# Patient Record
Sex: Female | Born: 1935 | Race: White | Hispanic: No | Marital: Married | Smoking: Former smoker
Health system: Southern US, Community
[De-identification: ages and names within clinical notes are randomized; demographics above are authoritative.]

## PROBLEM LIST (undated history)

## (undated) DIAGNOSIS — H269 Unspecified cataract: Secondary | ICD-10-CM

## (undated) DIAGNOSIS — I10 Essential (primary) hypertension: Secondary | ICD-10-CM

## (undated) DIAGNOSIS — E785 Hyperlipidemia, unspecified: Secondary | ICD-10-CM

## (undated) DIAGNOSIS — M1990 Unspecified osteoarthritis, unspecified site: Secondary | ICD-10-CM

## (undated) DIAGNOSIS — F329 Major depressive disorder, single episode, unspecified: Secondary | ICD-10-CM

## (undated) DIAGNOSIS — F0390 Unspecified dementia without behavioral disturbance: Secondary | ICD-10-CM

## (undated) DIAGNOSIS — E119 Type 2 diabetes mellitus without complications: Secondary | ICD-10-CM

## (undated) DIAGNOSIS — J449 Chronic obstructive pulmonary disease, unspecified: Secondary | ICD-10-CM

## (undated) DIAGNOSIS — M810 Age-related osteoporosis without current pathological fracture: Secondary | ICD-10-CM

## (undated) DIAGNOSIS — F32A Depression, unspecified: Secondary | ICD-10-CM

## (undated) HISTORY — DX: Essential (primary) hypertension: I10

## (undated) HISTORY — DX: Age-related osteoporosis without current pathological fracture: M81.0

## (undated) HISTORY — DX: Unspecified cataract: H26.9

## (undated) HISTORY — DX: Unspecified osteoarthritis, unspecified site: M19.90

## (undated) HISTORY — DX: Major depressive disorder, single episode, unspecified: F32.9

## (undated) HISTORY — DX: Type 2 diabetes mellitus without complications: E11.9

## (undated) HISTORY — DX: Hyperlipidemia, unspecified: E78.5

## (undated) HISTORY — DX: Depression, unspecified: F32.A

## (undated) HISTORY — DX: Unspecified dementia, unspecified severity, without behavioral disturbance, psychotic disturbance, mood disturbance, and anxiety: F03.90

## (undated) HISTORY — DX: Chronic obstructive pulmonary disease, unspecified: J44.9

---

## 1998-11-19 ENCOUNTER — Encounter: Payer: Self-pay | Admitting: Emergency Medicine

## 1998-11-19 ENCOUNTER — Emergency Department (HOSPITAL_COMMUNITY): Admission: EM | Admit: 1998-11-19 | Discharge: 1998-11-19 | Payer: Self-pay | Admitting: Emergency Medicine

## 1998-11-28 ENCOUNTER — Emergency Department (HOSPITAL_COMMUNITY): Admission: EM | Admit: 1998-11-28 | Discharge: 1998-11-28 | Payer: Self-pay | Admitting: Emergency Medicine

## 1998-12-12 ENCOUNTER — Emergency Department (HOSPITAL_COMMUNITY): Admission: EM | Admit: 1998-12-12 | Discharge: 1998-12-12 | Payer: Self-pay | Admitting: Emergency Medicine

## 1999-01-06 ENCOUNTER — Emergency Department (HOSPITAL_COMMUNITY): Admission: EM | Admit: 1999-01-06 | Discharge: 1999-01-06 | Payer: Self-pay | Admitting: Emergency Medicine

## 2002-12-18 ENCOUNTER — Emergency Department (HOSPITAL_COMMUNITY): Admission: EM | Admit: 2002-12-18 | Discharge: 2002-12-18 | Payer: Self-pay | Admitting: Emergency Medicine

## 2004-03-26 ENCOUNTER — Emergency Department (HOSPITAL_COMMUNITY): Admission: EM | Admit: 2004-03-26 | Discharge: 2004-03-26 | Payer: Self-pay | Admitting: Emergency Medicine

## 2004-06-30 ENCOUNTER — Inpatient Hospital Stay (HOSPITAL_COMMUNITY): Admission: EM | Admit: 2004-06-30 | Discharge: 2004-07-09 | Payer: Self-pay | Admitting: Emergency Medicine

## 2004-11-16 ENCOUNTER — Inpatient Hospital Stay (HOSPITAL_COMMUNITY): Admission: EM | Admit: 2004-11-16 | Discharge: 2004-11-23 | Payer: Self-pay | Admitting: Emergency Medicine

## 2006-10-11 ENCOUNTER — Encounter (HOSPITAL_BASED_OUTPATIENT_CLINIC_OR_DEPARTMENT_OTHER): Admission: RE | Admit: 2006-10-11 | Discharge: 2006-11-01 | Payer: Self-pay | Admitting: Surgery

## 2006-12-06 ENCOUNTER — Encounter: Admission: RE | Admit: 2006-12-06 | Discharge: 2006-12-06 | Payer: Self-pay | Admitting: *Deleted

## 2008-01-23 ENCOUNTER — Emergency Department (HOSPITAL_COMMUNITY): Admission: EM | Admit: 2008-01-23 | Discharge: 2008-01-23 | Payer: Self-pay | Admitting: Emergency Medicine

## 2008-04-11 ENCOUNTER — Encounter: Admission: RE | Admit: 2008-04-11 | Discharge: 2008-04-11 | Payer: Self-pay | Admitting: Internal Medicine

## 2009-05-11 IMAGING — US MAMMO-RUNI-US
1 series · 6 of 6 positions shown · non-contrast
Comparison: NONE

CLINICAL DATA: Zeinab Tiger, RT(R)(M)   
Diagnostic  Mammogram.  Six-month follow-up. 

RIGHT BREAST MAMMOGRAM AND RIGHT BREAST ULTRASOUND

[Series 1: us breast · 0.07mm/px · 6 of 6 slices shown]
[im 1/6]
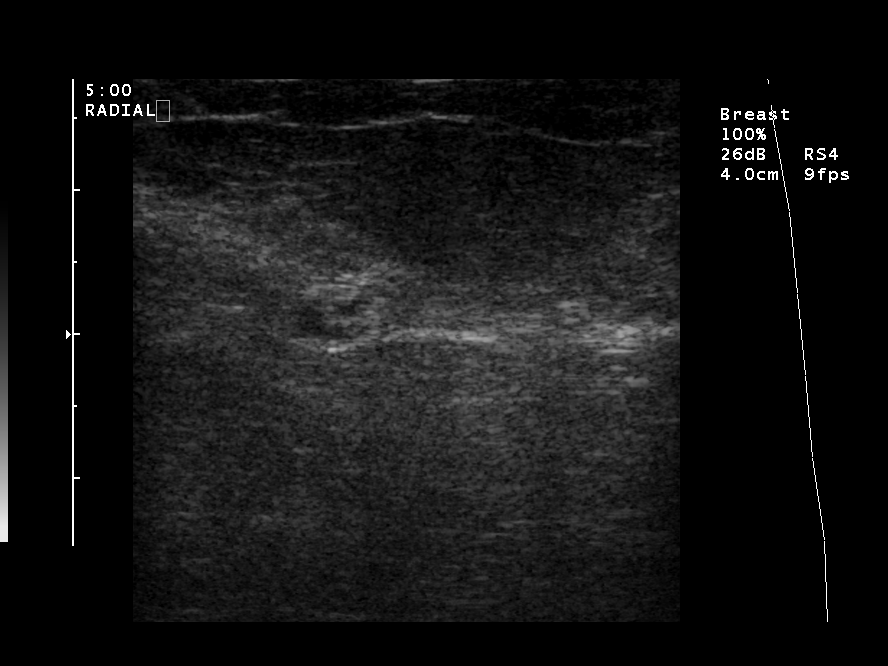
[im 2/6]
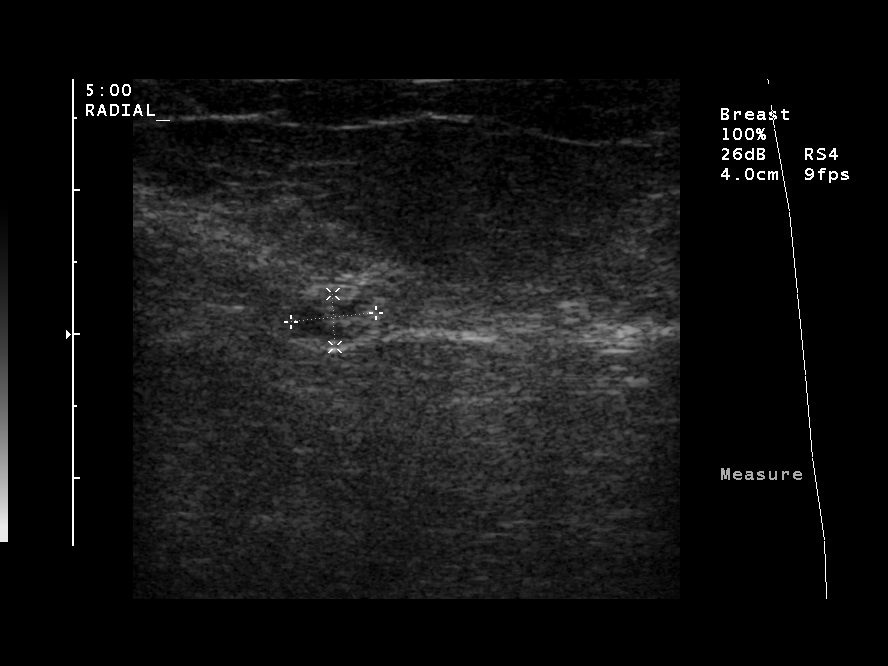
[im 3/6]
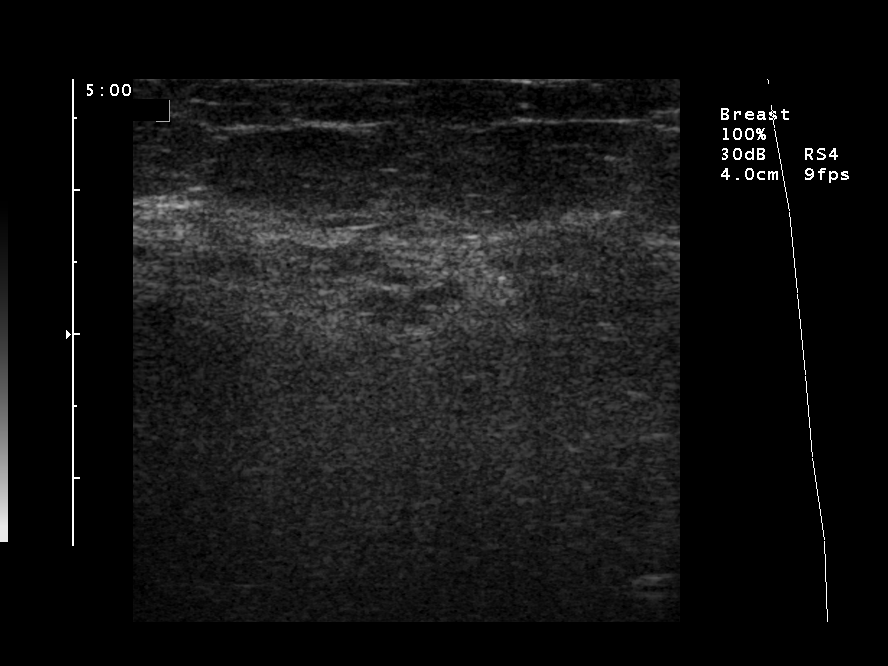
[im 4/6]
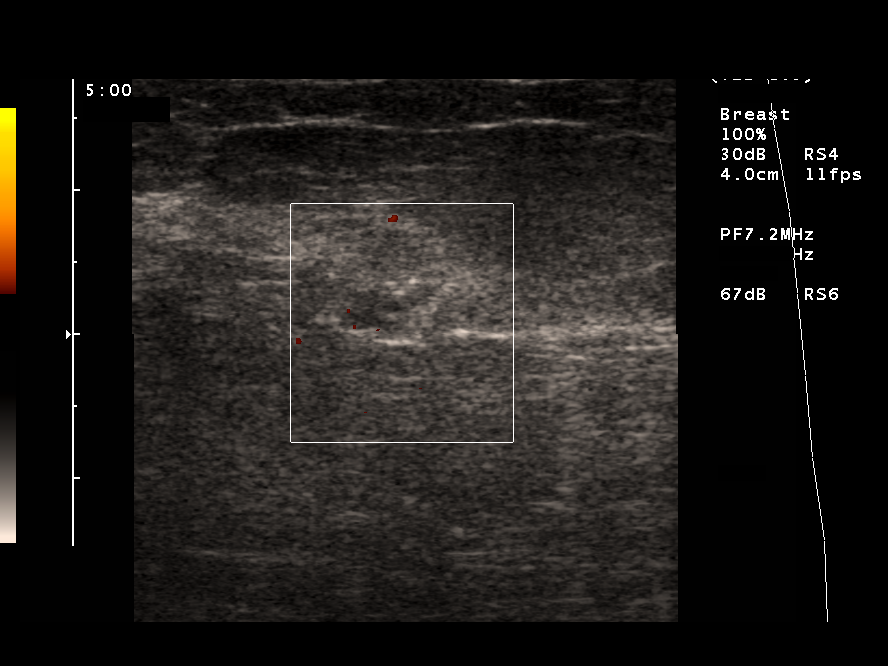
[im 5/6]
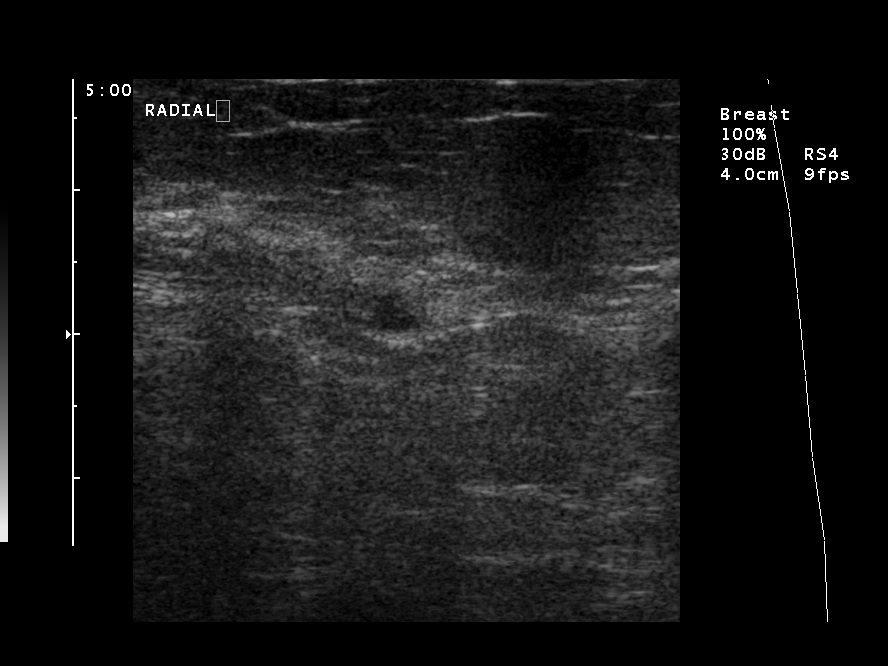
[im 6/6]
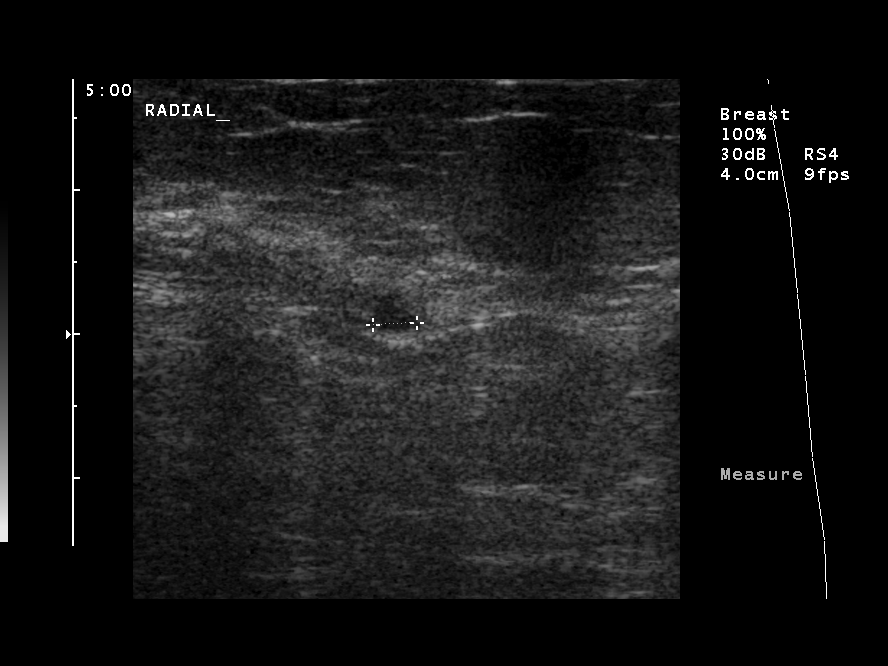

[6 of 6 positions shown; findings below may reference images not displayed]

FINDINGS: There is a moderately dense breast parenchyma.  There 
is a prior mammogram dated 12-17-05 for comparison.  The patient 
was difficult to image because she was unable to fully cooperate.  
There are benign calcifications seen in the right breast.  The 
mass noted in the [DATE] position in the right breast appears to be 
decreased in size.  There is no evidence of architectural 
distortion or malignant appearing microcalcifications. Ultrasound 
was performed of the right breast.  The [DATE] position was again 
evaluated.  There is a cluster of cysts seen.  This appears to be 
decreased in size compared to the previous ultrasound study of 
12-31-05.  The overall size of the cluster is 6 x 4 x 3 mm.  
Previously this measured 11 x 3 x 7 mm.
IMPRESSION: The mass in the [DATE] position of the right breast is 
decreased in size.  There is no mammographic evidence of 
malignancy. I recommend six-month interval follow-up right breast 
mammogram.  This will coincide with a time for a bilateral 
mammogram. The cluster of cysts seen in the [DATE] position of the 
right breast has decreased in size.  This does not need to be 
further evaluated. The patient was informed at the time of the 
examination of the findings and recommendations by verbal and 
written lay report. Computer assisted (Second Look) technology was 
used as an aid in interpretation of this study. BI-RADS 3 - 
08/16/2006 Dict Date: 08/16/2006  Trans Date: 08/16/2006 JH  ROMUALDO

## 2010-06-16 NOTE — Assessment & Plan Note (Signed)
[facility]   NAME:  Suzanne Bell, Suzanne Bell NO.:  [id number]   MEDICAL RECORD NO.:  [id number]      DATE OF BIRTH:  [birth year]   PHYSICIAN:  Jake Shark A. Tanda Rockers, M.D. VISIT DATE:  10/24/2006                                   OFFICE VISIT   SUBJECTIVE:  Suzanne Bell is an 77 year old lady who is referred by Dr.  Talbert Cage of the [facility] in [place] for evaluation  of an ulceration on the right ankle.   IMPRESSION:  Stasis ulcer lateral malleolus of the right ankle.   RECOMMENDATIONS:  Proceed with an Radio broadcast assistant protocol.   SUBJECTIVE:  Suzanne Bell is an 77 year old lady who is a resident of  [facility].  She has severe Alzheimer's disease and is really unable to  communicate her recent history.  She is transported by her medical  attendants who are not thoroughly versed in her past history.  Nevertheless, review of the accompanying documents shows that the  patient has complained of this wound for at least one week, and it has  been treated with a course of antibiotics.   Her medication list discloses that she has no known allergies, and her  active medications include aspirin 81 mg a day, Colace, lisinopril,  Zocor, Seroquel, Xanax, K-Dur, Lexapro and Levaquin.  Past surgical  history, family history, social history and review of systems are non-  obtainable due to the patient's physical and mental status.   OBJECTIVE:  VITAL SIGNS:  Blood pressure is 141/65, respirations are 14,  pulse rate 62, temperature is 97.1.  HEENT:  Remarkable for an opacity in the right eye.  There is equal  ocular movement.  The trachea is midline.  Thyroid is nonpalpable.  LUNGS:  Clear.  HEART:  Sounds were normal.  ABDOMEN:  Soft.  EXTREMITY:  Remarkable for bilateral 2+ edema with an area of necrosis  on the right lateral malleolus.  There is no excessive drainage.  There  is no hyperemia.  There is no evidence of ascending infection.  Both  feet  are warm with a brisk capillary refill.  Pedal pulses are  indeterminate, but the ankle-brachial index accompanying the patient  shows an ABI of 0.86 on the right and 0.94 on the left.  NEUROLOGICAL:  The patient retains protective sensation as judged by the  [facility] filament.  Neurologically, the patient has markedly impaired  memory.  She does not remember her breakfast or if she did have  breakfast, or if she did, what she ate.  She is disoriented to time and  place.  She has minimum quantitative and cognitive retention.   DISCUSSION:  This 77 year old lady has an early stasis ulceration over  the right lateral malleolus.  We will place her in an Unna boot and will  advise precaution to avoid extreme swelling and dependency.  We have  explained these  precautions to her attendants in terms they seem to understand.  We will  also make available immediately this consultation to Dr. Talbert Cage.  We  will reevaluate the patient in one week.  We have also noted the  patient's increased risks for falls and have ensured that the attendants  remain with her throughout her visitations.  Harold A. Tanda Rockers, M.D.  Electronically Signed     HAN/MEDQ  D:  10/24/2006  T:  10/24/2006  Job:  [id number]   cc:   [facility] Dr. Talbert Cage

## 2010-06-16 NOTE — Assessment & Plan Note (Signed)
[facility]   NAME:  Suzanne Bell, RYBOLT NO.:  [id number]   MEDICAL RECORD NO.:  [id number]      DATE OF BIRTH:  1919/05/10   PHYSICIAN:  Jake Shark A. Tanda Rockers, M.D. VISIT DATE:  10/31/2006                                   OFFICE VISIT   SUBJECTIVE:  Suzanne Bell is an 77 year old lady who returns for followup  of a stasis ulcer involving the right malleolus.  In the interim, we  have treated her with compression wraps.  She returns for followup.  There has been no interim pain, fever, drainage or malodor.   OBJECTIVE:  VITAL SIGNS:  Blood pressure is 158/71, respirations 16,  pulse rate 74, temperature 97.7.  EXTREMITIES:  Inspection of the right lateral ankle shows that the wound  is completely resolved.  There is some residual mild hyperemia, but  there is definitely no ulcerations.  There is no evidence of acute  ischemia.   ASSESSMENT:  Resolved wound.   PLAN:  We are discharging the patient.  We have instructed the caretaker  to continue using an Ace bandage for compression when the patient has  extended periods of dependency.  We will reevaluate her on a p.r.n.  basis.      Harold A. Tanda Rockers, M.D.  Electronically Signed     HAN/MEDQ  D:  10/31/2006  T:  11/01/2006  Job:  [id number]   cc:   Dr. Livingston Diones

## 2010-06-19 NOTE — Discharge Summary (Signed)
NAME:  Suzanne Bell, VARY NO.:  [id number]   MEDICAL RECORD NO.:  [id number]          PATIENT TYPE:  INP   LOCATION:  1411                         FACILITY:  [facility]   PHYSICIAN:  Theone Stanley, MD   DATE OF BIRTH:  05-26-1919   DATE OF ADMISSION:  06/30/2004  DATE OF DISCHARGE:                           DISCHARGE SUMMARY - REFERRING   ADMISSION DIAGNOSES:  1.  Alzheimer's disease.  2.  Panic disorder.  3.  Failure to thrive.   DISCHARGE DIAGNOSES:  1.  Urinary tract infection.  2.  Alzheimer's disease.  3.  Panic disorder.  4.  Failure to thrive.   CONSULTATIONS:  None.   PROCEDURES AND DIAGNOSTIC TESTS:  CT of the head performed on July 04, 2004.  No acute intracranial findings.  Stable cerebral atrophy.   HOSPITAL COURSE:  Mrs. Dusing is an 77 year old female who has been living  with her husband for many years who has chronic medical problem of panic  disorder.  Family has noticed increasing problems with memory and has become  increasingly reliant on her husband.  She no longer dresses herself, feeds  herself or baths herself.  She relies heavily on her husband.  She, in fact,  falls frequently at home.  She has left the house looking for her home.  Unfortunately, her husband fell about two weeks prior to admission and had  to go to an extended care facility.  The family was attempting to take care  of her, however, they felt that they could not, so they brought her here to  the hospital.  During her stay in the hospital, she subsequently developed  urinary tract infection, became very febrile and she was started on broad  spectrum antibiotics, switched to ciprofloxacin and has been going well  since then.  Also of note, she has evidence of hypertension.  She was  started on hydrochlorothiazide with intermittent episodes of hypokalemia.  Because of this, this will be discontinued and her Norvasc which was  started, will be increased to 10 mg.  Patient  did have a cough while here in  the hospital.  A chest x-ray was performed which did not show any active  issues.  Patient is discharged to extended care facility in stable  condition.   DIET:  Regular.   DISCHARGE MEDICATIONS:  1.  Ciprofloxacin 500 mg one p.o. b.i.d. for additional four days.  2.  Norvasc 10 mg one p.o. daily.  3.  Albuterol MDI or nebulizer q.i.d.  4.  Ensure t.i.d.  5.  Tylenol 650 mg q.4-6h. p.r.n.  6.  Haloperidol 0.5 to 1 mg p.o. q.6h. p.r.n.  7.  Xanax 0.25 mg q.i.d.  8.  Zofran 4 mg one p.o. q.6h. p.r.n.   FOLLOW UP:  Patient is to follow up with Dr. Arvilla Market in four weeks.  While  in the extended care facility, patient will need a follow-up BMP to check  her potassium.       AEJ/MEDQ  D:  07/09/2004  T:  07/09/2004  Job:  [id number]   cc:   Donia Guiles,  M.D.  [address] [place]  [place] [address]  Fax: [phone]

## 2010-06-19 NOTE — H&P (Signed)
NAME:  Suzanne Bell, BOWERY NO.:  [id number]   MEDICAL RECORD NO.:  [id number]          PATIENT TYPE:  OBV   LOCATION:  1823                         FACILITY:  [facility]   PHYSICIAN:  Sherin Quarry, MD      DATE OF BIRTH:  03-Mar-1919   DATE OF ADMISSION:  11/15/2004  DATE OF DISCHARGE:                                HISTORY & PHYSICAL   Renesmay Nesbitt is an 77 year old lady who lives at home with her elderly  husband.  Their son also stays with him much of the time.  There are two  other children who also follow their situation closely.  Family members have  observed Mrs. Slovacek seems to be gradually deteriorating in terms her mental  function.  She will pack her bags every day to go see her mother, and  frequently expresses a desire to go to heaven.  She he is often very anxious  and continues to require Xanax 1 mg daily for management of panic attacks.  Family has observed that she is very unsteady on her feet and has fallen at  least three times recently.  Her husband is very concerned about her  welfare, and is responsible for feeding her.  She wears Depends as she is  incontinent of urine.  She will ambulate with a walker.  She requires  assistance with dressing and bathing.  Tonight the patient apparently put on  her tennis shoes and stood up from bed with the idea of walking across the  room, when she did this she became unsteady on her feet and fell landing on  her face.  She has sustained a nondisplaced nasal fracture.  Initially,  there was a lot of bleeding from the nose, but fortunately this has stopped.  She is brought to the emergency room where her daughter and son indicate  that they feel very strongly that she should not return home because it is  an unsafe environment and she should be placed at least on a short-term  basis in a nursing home facility.  The daughter apparently has health care  power of attorney.  In the emergency room a CT scan of the facial  bones was  done which confirmed the nondisplaced nasal fracture, and a CT scan of brain  was negative.   PAST MEDICAL HISTORY:  There are no allergies.  The only medication the  patient takes is Xanax 1 mg q.i.d. Illnesses are none with the exception of  progressive dementia.  Operations are none.  Hospitalizations are otherwise  none except for a hospitalization in May of this year which was with the  purpose of arranging for nursing home placement at that time.   FAMILY HISTORY:  Her daughter died as result of brain tumor.  Her two  brothers died from colon cancer.  She has a sister who has a history of  diabetes and also deceased.   SOCIAL HISTORY:  She does not smoke.  She does not use alcohol.  She has  deficient vision and is a greatly impaired by problems related to her brain  function as described above.   REVIEW OF SYSTEMS:  Obtained from the patient's daughter.  HEAD:  Her  daughter reports that she does not complain of headache.  EAR/NOSE/THROAT:  She does not complain of earache, sinus pain or sore throat, of course at  this point she has some soreness related to the nasal injury.  CHEST:  She  does not appear to have any problems with coughing, wheezing or shortness of  breath.  CARDIOVASCULAR:  There is no apparent problems with chest pain or  orthopnea.  GI:  She has no problems with indigestion, heartburn, vomiting  or diarrhea.  GU:  She was recently treated for a urinary tract infection.  NEURO:  See above.  Note that during the course of my conversation with the  patient's daughter, I brought the issue of her code status.  The patient and  her husband apparently both expressed a strong desire to be full code  status, the husband feels very strongly that his wife should be placed on a  respirator if she should have any impairment of her breathing function.  The  daughter who has health care power of attorney plans to follow these wishes.   PHYSICAL EXAMINATION:   HEENT:  Exam is remarkable for a cataract in the  right eye, also there is swelling around the nasal area.  The nose does not  appear to be obviously displaced.  There is no blood in the nares, the  patient is not having any respiratory distress related to nasal obstruction.  The tympanic membranes were clear.  Pharynx is without erythema or exudate.  NECK:  Supple.  CHEST:  Clear.  Examination the back reveals no CVA or point tenderness.  CARDIOVASCULAR:  Reveals normal S1, S2.  There are no rubs, murmurs or  gallops.  ABDOMEN:  Benign.  There are normal bowel sounds.  There is no masses or  tenderness.  No guarding or rebound.  The patient wears Depends.  NEUROLOGIC:  Testing and examination of the extremities is normal.   IMPRESSION:  1.  Nondisplaced nasal fracture.  2.  Progressively worsening dementia with frequent falls, unsafe home      environment.  3.  Chronic panic attacks, chronic Xanax therapy.   PLAN:  We will admit the patient for 24-hour observation and hopefully  arrange nursing home placement at the family's request.           ______________________________  Sherin Quarry, MD     SY/MEDQ  D:  11/15/2004  T:  11/15/2004  Job:  [id number]   cc:   Donia Guiles, M.D.  Fax: [phone]

## 2010-06-19 NOTE — Discharge Summary (Signed)
NAME:  Suzanne Bell NO.:  [id number]   MEDICAL RECORD NO.:  [id number]          PATIENT TYPE:  INP   LOCATION:  5729                         FACILITY:  [facility]   PHYSICIAN:  Sherin Quarry, MD      DATE OF BIRTH:  Jun 04, 1919   DATE OF ADMISSION:  11/15/2004  DATE OF DISCHARGE:  11/19/2004                                 DISCHARGE SUMMARY   HISTORY:  Suzanne Bell is a demented lady who lives at home with her elderly  husband. She is chronically anxious, confused and incontinent of urine. She  will generally ambulate with a walker and requires assistance with dressing  and bathing.  The patient initially presented to [facility] on  November 15, 2004; after sustaining a nondisplaced nasal fracture following a  fall.  In the emergency room a CT scan of the facial bones was done,  confirming the presence of this nasal fracture and the CT scan of the brain  was negative.   PHYSICAL EXAM:  HEENT:  At time of admission revealed a cataract in the  right eye as well as generalized swelling of the nasal area. There was no  displacement of the nasal structures.  CHEST:  Clear.  BACK:  No CVA or point tenderness.  CARDIOVASCULAR:  Reveals normal S1-S2 without gross murmurs or gallops.  ABDOMEN:  Benign with normal bowel sounds; without masses or tenderness.  NEUROLOGIC:  Testing examination of the extremities was normal.   HOSPITAL COURSE:  Because of the patient's worsening dementia, the main goal  of admission was that the family wished to arrange for nursing home  placement. This process took a prolonged period of time to accomplish,  during that time while in the hospital understandably the patient was  confused, particularly at night.  She required Posey restraints at night and  the use of sitters.  On October 18 she did receive one dose of Haldol  because of agitation.  On 10/19  the patient's family was offered nursing  home beds, but they decided that they  wanted the patient to go home.  Therefore,  the patient was discharged.   DISCHARGE DIAGNOSES:  1.  Nondisplaced nasal fracture.  2.  Worsening dementia with frequent falls.  3.  Chronic panic attacks, Xanax therapy.   DISCHARGE MEDICATIONS:  Xanax 1 mg q.i.d. which she has been on for many  years.   CONDITION AT TIME OF DISCHARGE:  Fair.           ______________________________  Sherin Quarry, MD     SY/MEDQ  D:  11/19/2004  T:  11/19/2004  Job:  [id number]

## 2010-06-19 NOTE — Discharge Summary (Signed)
NAME:  FLORENDA, WATT NO.:  [id number]   MEDICAL RECORD NO.:  [id number]          PATIENT TYPE:  INP   LOCATION:  5729                         FACILITY:  [facility]   PHYSICIAN:  Sherin Quarry, MD      DATE OF BIRTH:  03-20-1919   DATE OF ADMISSION:  11/15/2004  DATE OF DISCHARGE:                                 DISCHARGE SUMMARY   UPDATE:  Subsequent to October 19 the family who had originally decided  against nursing home placement and canceled this plan on October 19 decided  that they did wish to proceed with nursing home placement.  Between October  19 and October 23 the patient remained in the hospital.  She would be  agitated at night and relatively calm during the day.  She required  placement of a Posey vest at night.  She would intermittently complain of  left leg pain.  Careful examination of the left leg showed full range of  motion of the hip, full range of motion of the knee, no signs of swelling,  erythema, or ecchymosis.  To be thorough x-rays were obtained of the hip and  knee and these were negative.  Possibly the patient's complaints were  secondary to degenerative joint disease.  At the time of discharge discharge  diagnoses were unchanged from those of October 19.   DISCHARGE DIAGNOSES:  1.  Nondisplaced nasal fracture.  2.  Worsening dementia with frequent falls.  3.  Chronic panic attacks, long-standing history of Xanax therapy.   DISCHARGE MEDICATIONS:  1.  Xanax 1 mg q.i.d.  2.  K-Dur 10 mEq b.i.d.  3.  Darvocet one to two every six hours p.r.n. for pain.  4.  Tylenol 325 mg one to two every six hours p.r.n. for pain.   CONDITION ON DISCHARGE:  Fair.           ______________________________  Sherin Quarry, MD     SY/MEDQ  D:  11/23/2004  T:  11/23/2004  Job:  [id number]
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
## 2011-07-29 ENCOUNTER — Ambulatory Visit (HOSPITAL_COMMUNITY)
Admission: RE | Admit: 2011-07-29 | Discharge: 2011-07-29 | Disposition: A | Discharge status: Home / Self Care | Payer: Medicare Other | Source: Ambulatory Visit | Attending: Internal Medicine | Admitting: Internal Medicine

## 2011-07-29 DIAGNOSIS — T17998A Other foreign object in respiratory tract, part unspecified causing other injury, initial encounter: Secondary | ICD-10-CM

## 2011-07-29 DIAGNOSIS — T17308A Unspecified foreign body in larynx causing other injury, initial encounter: Secondary | ICD-10-CM | POA: Insufficient documentation

## 2011-07-29 DIAGNOSIS — IMO0002 Reserved for concepts with insufficient information to code with codable children: Secondary | ICD-10-CM | POA: Insufficient documentation

## 2011-07-29 NOTE — Procedures (Signed)
Objective Swallowing Evaluation: Modified Barium Swallowing Study  Patient Details  Name: Suzanne Bell MRN: [id number] Date of Birth: [birth year]  Today's Date: 07/29/2011 Time: 1010-1055 SLP Time Calculation (min): 45 min  Past Medical History: No past medical history on file. Past Surgical History: No past surgical history on file. HPI:  78 yo resident of [facility] referred for MBS due to pt recently with emesis and coughing episodes with only first bolus of puree.  PMH + severe dementia, DM, recurrent falls in 04 and 06.  Per daughter [name], pt with recent pneumonia approx 3 weeks ago.  Daughter  acknowledges that pt had difficulties swallowing with gradual progression but with sudden worsening approx 3-4 weeks ago.  Medication list includes (but not limited to) Senokot, Namenda, Metamucil, Glucophage, Lisinopiril, Vit D per information sent from facility.  Pt's daughter denies CVA hx.         Assessment / Plan / Recommendation Clinical Impression  Clinical impression: Suspect oral-cognitive based dysphagia due to Alzheimer's disease, also ? facial nerve involvement given left anterior spillage of liquids.   No aspiration observed with all consistencies tested (moist cracker, pudding, nectar, thin) with inconsistent trace laryngeal penetration of liquids which is likely functional given pt's age.  Oral  difficulties with delayed transting of thicker viscocities and vertical masticastion pattern observed.  Decr oral strength resulted in oral posterior tongue mild residuals without pt awareness, nor did pt conduct cued dry swallow to clear.  Of note, pt accepted only small bolues of pudding (appr 1/4 tsp) throughout study.  Reflexive cough noted x1 with first bolus of study and incidental laryngeal penetration.  Further episodes of trace penetration did not result in cough.  In addition, pt coughed in sequence of three without barium visualized in larynx.  Question if pt's recent health issue,  (pna per daughter) has impacted her swallow ability and intake.       Treatment Recommendation       Diet Recommendation Dysphagia 1 (Puree);Thin liquid   Liquid Administration via: Cup;Straw (encourage self feeding if able-slp had pt hold cup) Supervision: Full supervision/cueing for compensatory strategies Compensations: Check for pocketing;Slow rate;Unable to follow commands to complete;Small sips/bites (drink liquids throughout meal) Postural Changes and/or Swallow Maneuvers: Upright 30-60 min after meal;Seated upright 90 degrees    Other  Recommendations Oral Care Recommendations: Oral care QID   Follow Up Recommendations  Skilled Nursing facility    Frequency and Duration        Pertinent Vitals/Pain Pt appeared comfortable, on room air, unable to ambulate per daughter    SLP Swallow Goals     General Date of Onset: 07/29/11 HPI: 78 yo resident of [facility] referred for MBS due to pt recently with emesis and coughing episodes with only first bolus of puree.  PMH + severe dementia, DM, recurrent falls in 04 and 06.  Per daughter [name], pt with recent pneumonia approx 3 weeks ago.  Daughter  acknowledges that pt had difficulties swallowing with gradual progression but with sudden worsening approx 3-4 weeks ago.  Medication list includes (but not limited to) Senokot, Namenda, Metamucil, Glucophage, Lisinopiril, Vit D per information sent from facility.  Pt's daughter denies CVA hx.     Type of Study: Modified Barium Swallowing Study Reason for Referral: Objectively evaluate swallowing function Previous Swallow Assessment: clinical swallow assessment Diet Prior to this Study: Dysphagia 1 (puree);Thin liquids Respiratory Status: Room air History of Recent Intubation: No Behavior/Cognition: Alert;Confused;Doesn't follow directions;Requires cueing;Decreased sustained attention (pt with  dementia decr ability to follow directions) Oral Cavity - Dentition: Edentulous (pt's daughter  reports pt has not worn dentures in six months) Oral Motor / Sensory Function:  (decr labial seal on left, facial nerve) Self-Feeding Abilities: Total assist (per daughter pt needs full assist) Patient Positioning: Upright in chair Baseline Vocal Quality: Clear Volitional Cough: Cognitively unable to elicit Volitional Swallow: Unable to elicit Anatomy: Within functional limits Pharyngeal Secretions: Not observed secondary MBS    Reason for Referral Objectively evaluate swallowing function   Oral Phase   Oral Phase:  Impaired  Pharyngeal Phase Pharyngeal Phase: Impaired   Cervical Esophageal Phase Cervical Esophageal Phase: Impaired    Donavan Burnet, MS [facility] SLP [phone]

## 2012-04-28 ENCOUNTER — Non-Acute Institutional Stay (SKILLED_NURSING_FACILITY): Payer: Medicare Other | Admitting: Internal Medicine

## 2012-04-28 DIAGNOSIS — F0280 Dementia in other diseases classified elsewhere without behavioral disturbance: Secondary | ICD-10-CM

## 2012-04-28 DIAGNOSIS — G309 Alzheimer's disease, unspecified: Secondary | ICD-10-CM

## 2012-04-28 DIAGNOSIS — I10 Essential (primary) hypertension: Secondary | ICD-10-CM

## 2012-04-28 DIAGNOSIS — E119 Type 2 diabetes mellitus without complications: Secondary | ICD-10-CM

## 2012-04-28 DIAGNOSIS — K5900 Constipation, unspecified: Secondary | ICD-10-CM

## 2012-04-29 NOTE — Progress Notes (Signed)
PROGRESS NOTE  DATE: 04/28/12  FACILITY: [facility]  LEVEL OF CARE: SNF  Routine Visit  CHIEF COMPLAINT:  Manage hypertension and diabetes mellitus  HISTORY OF PRESENT ILLNESS:  REASSESSMENT OF ONGOING PROBLEMS:  1. HTN: Pt 's HTN remains stable.  Staff do not report CP, sob, DOE, pedal edema, headaches, dizziness or visual disturbances.  No complications from the meds currently being used.  Last BP : 131/71, 110/50, 125/62.  Patient is a poor historian due to dementia.  2. DM:pt's DM remains stable.  staff deny polyuria, polydipsia, polyphagia, changes in vision or hypoglycemic episodes.  No complications noted from the medication presently being used.  Last hemoglobin A1c is: 6.4 in 12/13.  PAST MEDICAL HISTORY : Reviewed.  No changes.  CURRENT MEDICATIONS: Reviewed per [facility]  REVIEW OF SYSTEMS: Unobtainable due to dementia  PHYSICAL EXAMINATION  VS:  T 97.6              P 91            RR 20          BP 131/71             POX % 97                WT (Lb)  GENERAL: no acute distress, normal body habitus RESPIRATORY: breathing is even & unlabored, BS CTAB CARDIAC: RRR, no murmur,no extra heart sounds, no edema GI: abdomen soft, normal BS, no masses, no tenderness, no hepatomegaly, no splenomegaly PSYCHIATRIC: the patient is alert & oriented to person, affect & behavior appropriate  LABS/RADIOLOGY:  11/13 glucose 112, total protein 5.9, albumin 3.3 otherwise CMP normal 10/13 CBC normal  ASSESSMENT/PLAN:  1. hypertension-well controlled. 2.  Diabetes mellitus-well controlled. 3. Alzheimer's dementia-advanced. 4. Constipation-continue current laxatives. 5. anxiety-well controlled. 6. depression- stable.  CPT CODE: 16109

## 2012-07-19 ENCOUNTER — Encounter: Payer: Self-pay | Admitting: Adult Health

## 2012-07-21 ENCOUNTER — Non-Acute Institutional Stay (SKILLED_NURSING_FACILITY): Payer: Medicare Other | Admitting: Internal Medicine

## 2012-07-21 DIAGNOSIS — E119 Type 2 diabetes mellitus without complications: Secondary | ICD-10-CM

## 2012-07-21 DIAGNOSIS — K5900 Constipation, unspecified: Secondary | ICD-10-CM

## 2012-07-21 DIAGNOSIS — G309 Alzheimer's disease, unspecified: Secondary | ICD-10-CM

## 2012-07-21 DIAGNOSIS — I10 Essential (primary) hypertension: Secondary | ICD-10-CM

## 2012-07-21 DIAGNOSIS — F0280 Dementia in other diseases classified elsewhere without behavioral disturbance: Secondary | ICD-10-CM

## 2012-07-28 DIAGNOSIS — G309 Alzheimer's disease, unspecified: Secondary | ICD-10-CM | POA: Insufficient documentation

## 2012-07-28 DIAGNOSIS — I10 Essential (primary) hypertension: Secondary | ICD-10-CM | POA: Insufficient documentation

## 2012-07-28 DIAGNOSIS — K5900 Constipation, unspecified: Secondary | ICD-10-CM | POA: Insufficient documentation

## 2012-07-28 DIAGNOSIS — E119 Type 2 diabetes mellitus without complications: Secondary | ICD-10-CM | POA: Insufficient documentation

## 2012-07-28 NOTE — Progress Notes (Signed)
PROGRESS NOTE  DATE: 07/21/12  FACILITY: [facility]  LEVEL OF CARE: SNF  Routine Visit  CHIEF COMPLAINT:  Manage hypertension and diabetes mellitus  HISTORY OF PRESENT ILLNESS:  REASSESSMENT OF ONGOING PROBLEMS:  1. HTN: Pt 's HTN remains stable.  Staff do not report CP, sob, DOE, pedal edema, headaches, dizziness or visual disturbances.  No complications from the meds currently being used.  Last BP : 124/53, 131/71, 110/50, 125/62.  Patient is a poor historian due to dementia.  2. DM:pt's DM remains stable.  staff deny polyuria, polydipsia, polyphagia, changes in vision or hypoglycemic episodes.  No complications noted from the medication presently being used.  Last hemoglobin A1c is: 6.4 in 12/13.  In 6/14 hemoglobin A1c 5.6.  PAST MEDICAL HISTORY : Reviewed.  No changes.  CURRENT MEDICATIONS: Reviewed per [facility]  REVIEW OF SYSTEMS: Unobtainable due to dementia  PHYSICAL EXAMINATION  VS:  T 97.3     P 76   RR 18       BP 124/53     POX % 99                  GENERAL: no acute distress, normal body habitus RESPIRATORY: breathing is even & unlabored, BS CTAB CARDIAC: RRR, no murmur,no extra heart sounds, no edema GI: abdomen soft, normal BS, no masses, no tenderness, no hepatomegaly, no splenomegaly PSYCHIATRIC: the patient is alert & oriented to person, affect & behavior appropriate  LABS/RADIOLOGY:  6/14 glucose 180, BUN 33, creatinine 1.46 otherwise CMP normal  11/13 glucose 112, total protein 5.9, albumin 3.3 otherwise CMP normal 10/13 CBC normal  ASSESSMENT/PLAN:  hypertension-well controlled. Diabetes mellitus-well controlled. Alzheimer's dementia-advanced. Constipation-continue current laxatives. anxiety-well controlled. depression- stable. Check CBC.  CPT CODE: 13086

## 2012-08-18 ENCOUNTER — Non-Acute Institutional Stay (SKILLED_NURSING_FACILITY): Payer: Medicare Other | Admitting: Internal Medicine

## 2012-08-18 DIAGNOSIS — E119 Type 2 diabetes mellitus without complications: Secondary | ICD-10-CM

## 2012-08-18 DIAGNOSIS — G309 Alzheimer's disease, unspecified: Secondary | ICD-10-CM

## 2012-08-18 DIAGNOSIS — I10 Essential (primary) hypertension: Secondary | ICD-10-CM

## 2012-08-18 DIAGNOSIS — K5900 Constipation, unspecified: Secondary | ICD-10-CM

## 2012-08-18 DIAGNOSIS — F0280 Dementia in other diseases classified elsewhere without behavioral disturbance: Secondary | ICD-10-CM

## 2012-08-25 NOTE — Progress Notes (Signed)
PROGRESS NOTE  DATE: 08/18/12  FACILITY: [facility]  LEVEL OF CARE: SNF  Routine Visit  CHIEF COMPLAINT:  Manage hypertension and diabetes mellitus  HISTORY OF PRESENT ILLNESS:  REASSESSMENT OF ONGOING PROBLEMS:  HTN: Pt 's HTN remains stable.  Staff do not report CP, sob, DOE, pedal edema, headaches, dizziness or visual disturbances.  No complications from the meds currently being used.  Last BP : 124/53, 131/71, 110/50, 125/62, 123/56.  Patient is a poor historian due to dementia.  DM:pt's DM remains stable.  staff deny polyuria, polydipsia, polyphagia, changes in vision or hypoglycemic episodes.  No complications noted from the medication presently being used.  Last hemoglobin A1c is: 6.4 in 12/13.  In 6/14 hemoglobin A1c 5.6.  PAST MEDICAL HISTORY : Reviewed.  No changes.  CURRENT MEDICATIONS: Reviewed per [facility]  REVIEW OF SYSTEMS: Unobtainable due to dementia  PHYSICAL EXAMINATION  VS:  T 97.1     P 79   RR 18       BP 123/56     POX % 96                 GENERAL: no acute distress, normal body habitus RESPIRATORY: breathing is even & unlabored, BS CTAB CARDIAC: RRR, no murmur,no extra heart sounds, no edema GI: abdomen soft, normal BS, no masses, no tenderness, no hepatomegaly, no splenomegaly PSYCHIATRIC: the patient is alert & oriented to person, affect & behavior appropriate  LABS/RADIOLOGY:  6/14 glucose 180, BUN 33, creatinine 1.46 otherwise CMP normal, repeat BMP showed glucose 100 otherwise BMP normal, platelets 433 otherwise CBC normal  11/13 glucose 112, total protein 5.9, albumin 3.3 otherwise CMP normal 10/13 CBC normal  ASSESSMENT/PLAN:  hypertension-well controlled. Diabetes mellitus-well controlled. Alzheimer's dementia-advanced. Constipation-continue current laxatives. anxiety-well controlled. depression- stable.  CPT CODE: 16109

## 2012-09-29 ENCOUNTER — Non-Acute Institutional Stay (SKILLED_NURSING_FACILITY): Payer: Medicare Other | Admitting: Adult Health

## 2012-09-29 DIAGNOSIS — I10 Essential (primary) hypertension: Secondary | ICD-10-CM

## 2012-09-29 DIAGNOSIS — F329 Major depressive disorder, single episode, unspecified: Secondary | ICD-10-CM

## 2012-09-29 DIAGNOSIS — K5900 Constipation, unspecified: Secondary | ICD-10-CM

## 2012-09-29 DIAGNOSIS — E119 Type 2 diabetes mellitus without complications: Secondary | ICD-10-CM

## 2012-09-29 DIAGNOSIS — F0280 Dementia in other diseases classified elsewhere without behavioral disturbance: Secondary | ICD-10-CM

## 2012-10-27 ENCOUNTER — Non-Acute Institutional Stay (SKILLED_NURSING_FACILITY): Payer: Medicare Other | Admitting: Adult Health

## 2012-10-27 DIAGNOSIS — E119 Type 2 diabetes mellitus without complications: Secondary | ICD-10-CM

## 2012-10-27 DIAGNOSIS — F329 Major depressive disorder, single episode, unspecified: Secondary | ICD-10-CM

## 2012-10-27 DIAGNOSIS — F0280 Dementia in other diseases classified elsewhere without behavioral disturbance: Secondary | ICD-10-CM

## 2012-10-27 DIAGNOSIS — K5900 Constipation, unspecified: Secondary | ICD-10-CM

## 2012-10-27 DIAGNOSIS — I10 Essential (primary) hypertension: Secondary | ICD-10-CM

## 2012-10-27 NOTE — Progress Notes (Signed)
Patient ID: Suzanne Bell, female   DOB: Apr 25, 1919, 79 y.o.   MRN: [id number]       PROGRESS NOTE  DATE: 09/29/2012  FACILITY: Nursing Home Location: [facility]  LEVEL OF CARE: SNF (31)  Routine Visit  CHIEF COMPLAINT:  Manage Hypertension, Alzheimer's Dementia, Depression and Constipation  HISTORY OF PRESENT ILLNESS:  REASSESSMENT OF ONGOING PROBLEM(S):  HTN: Pt 's HTN remains stable.  Denies CP, sob, DOE, pedal edema, headaches, dizziness or visual disturbances.  No complications from the medications currently being used.  Last BP : 104/59  DEMENTIA: The dementia remaines stable and continues to function adequately in the current living environment with supervision.  The patient has had little changes in behavior. No complications noted from the medications presently being used.  DM:pt's DM remains stable.  Pt denies polyuria, polydipsia, polyphagia, changes in vision or hypoglycemic episodes.  No complications noted from the medication presently being used.   6/14  hemoglobin A1c 5.6  PAST MEDICAL HISTORY : Reviewed.  No changes.  CURRENT MEDICATIONS: Reviewed per [facility]  REVIEW OF SYSTEMS:  GENERAL: no change in appetite, no fatigue, no weight changes, no fever, chills or weakness RESPIRATORY: no cough, SOB, DOE, wheezing, hemoptysis CARDIAC: no chest pain, edema or palpitations GI: no abdominal pain, diarrhea, constipation, heart burn, nausea or vomiting  PHYSICAL EXAMINATION  VS:  T97.6       P77      RR18      BP104/59     POX95 %     WT144.8 (Lb)  GENERAL: no acute distress, normal body habitus EYES: conjunctivae normal, sclerae normal, normal eye lids NECK: supple, trachea midline, no neck masses, no thyroid tenderness, no thyromegaly LYMPHATICS: no LAN in the neck, no supraclavicular LAN RESPIRATORY: breathing is even & unlabored, BS CTAB CARDIAC: RRR, no murmur,no extra heart sounds, no edema GI: abdomen soft, normal BS, no masses, no tenderness,  no hepatomegaly, no splenomegaly PSYCHIATRIC: the patient is alert & oriented to person, affect & behavior appropriate  LABS/RADIOLOGY: 6/14 glucose 180, BUN 33, creatinine 1.46 otherwise CMP normal, repeat BMP showed glucose 100 otherwise BMP normal, platelets 433 otherwise CBC normal 11/13 glucose 112, total protein 5.9, albumin 3.3 otherwise CMP normal 10/13 CBC normal   ASSESSMENT/PLAN:  Diabetes Mellitus, type 2 - diet-controlled  Depression - stable  Alzheimer's  Dementia - stable  Hypertension -  Well-controlled  Constipation - no complaints   CPT CODE: 40981

## 2012-10-27 NOTE — Progress Notes (Signed)
Patient ID: Suzanne Bell, female   DOB: [birth year], 79 y.o.   MRN: [id number]  DATE:  10/27/12  FACILITY: Nursing Home Location: [facility]  LEVEL OF CARE: SNF (31)  Routine Visit  CHIEF COMPLAINT:  Manage Hypertension, Alzheimer's Dementia, Depression and Constipation  HISTORY OF PRESENT ILLNESS:  REASSESSMENT OF ONGOING PROBLEM(S):  HTN: Pt 's HTN remains stable.  Denies CP, sob, DOE, pedal edema, headaches, dizziness or visual disturbances.  No complications from the medications currently being used.  Last BP : 121/73  CONSTIPATION: The constipation remains stable. No complications from the medications presently being used. Patient denies ongoing constipation, abdominal pain, nausea or vomiting.  DEPRESSION: The depression remains stable. Patient denies ongoing feelings of sadness, insomnia, anedhonia or lack of appetite. No complications reported from the medications currently being used. Staff do not report behavioral problems.  DM:pt's DM remains stable.  Pt denies polyuria, polydipsia, polyphagia, changes in vision or hypoglycemic episodes.  No medication presently being used.    6/14  hemoglobin A1c 5.6  PAST MEDICAL HISTORY : Reviewed.  No changes.  CURRENT MEDICATIONS: Reviewed per [facility]  REVIEW OF SYSTEMS: Unobtainable due to Dementia  PHYSICAL EXAMINATION  VS:  T96.9      P65      RR20      BP121/73    POX97 %     WT144.4 (Lb)  GENERAL: no acute distress, normal body habitus NECK: supple, trachea midline, no neck masses, no thyroid tenderness, no thyromegaly LYMPHATICS: no LAN in the neck, no supraclavicular LAN RESPIRATORY: breathing is even & unlabored, BS CTAB CARDIAC: RRR, no murmur,no extra heart sounds, no edema GI: abdomen soft, normal BS, no masses, no tenderness, no hepatomegaly, no splenomegaly PSYCHIATRIC: the patient is alert & oriented to person, affect & behavior appropriate  LABS/RADIOLOGY: 6/14 glucose 180, BUN 33, creatinine  1.46 otherwise CMP normal, repeat BMP showed glucose 100 otherwise BMP normal, platelets 433 otherwise CBC normal 11/13 glucose 112, total protein 5.9, albumin 3.3 otherwise CMP normal 10/13 CBC normal   ASSESSMENT/PLAN:  Diabetes Mellitus, type 2 - diet-controlled  Depression - stable; continue Lexapro 2.5 mg PO Q D  Alzheimer's  Dementia - stable; continue Namenda 10 mg PO BID and Aricept 10 mg 1 tab PO Q D  Hypertension -  Well-controlled; continue Lisinopril 2.5 mg 1 tab PO Q D  Constipation - no complaints; continue Senokot 2 tabs PO Q HS and Colace 100 mg PO Q D   CPT CODE: 40981

## 2012-11-15 ENCOUNTER — Non-Acute Institutional Stay (SKILLED_NURSING_FACILITY): Payer: PRIVATE HEALTH INSURANCE | Admitting: Internal Medicine

## 2012-11-15 DIAGNOSIS — I10 Essential (primary) hypertension: Secondary | ICD-10-CM

## 2012-11-15 DIAGNOSIS — K5900 Constipation, unspecified: Secondary | ICD-10-CM

## 2012-11-15 DIAGNOSIS — F0280 Dementia in other diseases classified elsewhere without behavioral disturbance: Secondary | ICD-10-CM

## 2012-11-15 DIAGNOSIS — E119 Type 2 diabetes mellitus without complications: Secondary | ICD-10-CM

## 2012-11-15 NOTE — Addendum Note (Signed)
Addended by: Angela Cox on: 11/15/2012 09:30 PM   Modules accepted: Level of Service

## 2012-11-15 NOTE — Progress Notes (Signed)
PROGRESS NOTE  DATE: 11/15/12  FACILITY: [facility]  LEVEL OF CARE: SNF  Routine Visit  CHIEF COMPLAINT:  Manage hypertension, Alzheimer's dementia and diabetes mellitus  HISTORY OF PRESENT ILLNESS:  REASSESSMENT OF ONGOING PROBLEMS:  HTN: Pt 's HTN remains stable.  Staff do not report CP, sob, DOE, pedal edema, headaches, dizziness or visual disturbances.  No complications from the meds currently being used.  Last BP : 124/53, 131/71, 110/50, 125/62, 123/56, 117/67.  Patient is a poor historian due to dementia.  DM:pt's DM remains stable.  staff deny polyuria, polydipsia, polyphagia, changes in vision or hypoglycemic episodes.  No complications noted from the medication presently being used.  Last hemoglobin A1c is: 6.4 in 12/13.  In 6/14 hemoglobin A1c 5.6.  DEMENTIA: The dementia remaines stable and continues to function adequately in the current living environment with supervision.  The patient has had little changes in behavior. No complications noted from the medications presently being used.  Advanced.  Pt is a poor historian.  PAST MEDICAL HISTORY : Reviewed.  No changes.  CURRENT MEDICATIONS: Reviewed per [facility]  REVIEW OF SYSTEMS: Unobtainable due to dementia  PHYSICAL EXAMINATION  VS:  T 98.8     P 76   RR 18       BP 117/67     POX % 95                GENERAL: no acute distress, normal body habitus EYES: normal sclerae, normal conjunctivae, no discharge NECK: no thyromegaly, no masses, trachea midline LYMPHATICS: no cervical LAN, no supraclavicular LAN RESPIRATORY: breathing is even & unlabored, BS CTAB CARDIAC: RRR, no murmur,no extra heart sounds, no edema GI: abdomen soft, normal BS, no masses, no tenderness, no hepatomegaly, no splenomegaly PSYCHIATRIC: the patient is alert & oriented to person, affect & behavior appropriate  LABS/RADIOLOGY:  6/14 glucose 180, BUN 33, creatinine 1.46 otherwise CMP normal, repeat BMP showed glucose 100 otherwise BMP  normal, platelets 433 otherwise CBC normal.  Repeat BMP: glc 100 ow bmp nl  11/13 glucose 112, total protein 5.9, albumin 3.3 otherwise CMP normal 10/13 CBC normal  ASSESSMENT/PLAN:  hypertension-well controlled. Diabetes mellitus-well controlled. Alzheimer's dementia-advanced. Constipation-continue current laxatives. anxiety-well controlled. depression- stable.  CPT CODE: 16109

## 2012-12-06 ENCOUNTER — Non-Acute Institutional Stay (SKILLED_NURSING_FACILITY): Payer: PRIVATE HEALTH INSURANCE | Admitting: Internal Medicine

## 2012-12-06 DIAGNOSIS — I10 Essential (primary) hypertension: Secondary | ICD-10-CM

## 2012-12-06 DIAGNOSIS — K5900 Constipation, unspecified: Secondary | ICD-10-CM

## 2012-12-06 DIAGNOSIS — E119 Type 2 diabetes mellitus without complications: Secondary | ICD-10-CM

## 2012-12-06 DIAGNOSIS — F0280 Dementia in other diseases classified elsewhere without behavioral disturbance: Secondary | ICD-10-CM

## 2012-12-06 NOTE — Progress Notes (Signed)
PROGRESS NOTE  DATE: 12/06/12  FACILITY: [facility]  LEVEL OF CARE: SNF  Routine Visit  CHIEF COMPLAINT:  Manage hypertension, Alzheimer's dementia and diabetes mellitus  HISTORY OF PRESENT ILLNESS:  REASSESSMENT OF ONGOING PROBLEMS:  HTN: Pt 's HTN remains stable.  Staff do not report CP, sob, DOE, pedal edema, headaches, dizziness or visual disturbances.  No complications from the meds currently being used.  Last BP : 124/53, 131/71, 110/50, 125/62, 123/56, 117/67.  Patient is a poor historian due to dementia.  DM:pt's DM remains stable.  staff deny polyuria, polydipsia, polyphagia, changes in vision or hypoglycemic episodes.  No complications noted from the medication presently being used.  Last hemoglobin A1c is: 6.4 in 12/13.  In 6/14 hemoglobin A1c 5.6.  DEMENTIA: The dementia remaines stable and continues to function adequately in the current living environment with supervision.  The patient has had little changes in behavior. No complications noted from the medications presently being used.  Advanced.  Pt is a poor historian.  PAST MEDICAL HISTORY : Reviewed.  No changes.  CURRENT MEDICATIONS: Reviewed per [facility]  REVIEW OF SYSTEMS: Unobtainable due to dementia  PHYSICAL EXAMINATION  VS:  T 98.8     P 76   RR 18       BP 117/67     POX % 95                GENERAL: no acute distress, normal body habitus EYES: normal sclerae, normal conjunctivae, no discharge NECK: no thyromegaly, no masses, trachea midline LYMPHATICS: no cervical LAN, no supraclavicular LAN RESPIRATORY: breathing is even & unlabored, BS CTAB CARDIAC: RRR, no murmur,no extra heart sounds, no edema GI: abdomen soft, normal BS, no masses, no tenderness, no hepatomegaly, no splenomegaly PSYCHIATRIC: the patient is alert & oriented to person, affect & behavior appropriate  LABS/RADIOLOGY:  6/14 glucose 180, BUN 33, creatinine 1.46 otherwise CMP normal, repeat BMP showed glucose 100 otherwise BMP  normal, platelets 433 otherwise CBC normal.  Repeat BMP: glc 100 ow bmp nl  11/13 glucose 112, total protein 5.9, albumin 3.3 otherwise CMP normal 10/13 CBC normal  ASSESSMENT/PLAN:  hypertension-well controlled. Diabetes mellitus-well controlled. Alzheimer's dementia-advanced. Constipation-continue current laxatives. anxiety-well controlled. depression- stable.  CPT CODE: 16109

## 2013-01-03 ENCOUNTER — Non-Acute Institutional Stay (SKILLED_NURSING_FACILITY): Payer: PRIVATE HEALTH INSURANCE | Admitting: Internal Medicine

## 2013-01-03 DIAGNOSIS — E119 Type 2 diabetes mellitus without complications: Secondary | ICD-10-CM

## 2013-01-03 DIAGNOSIS — I10 Essential (primary) hypertension: Secondary | ICD-10-CM

## 2013-01-03 DIAGNOSIS — K5900 Constipation, unspecified: Secondary | ICD-10-CM

## 2013-01-03 DIAGNOSIS — F0280 Dementia in other diseases classified elsewhere without behavioral disturbance: Secondary | ICD-10-CM

## 2013-01-05 ENCOUNTER — Encounter: Payer: Self-pay | Admitting: Internal Medicine

## 2013-01-05 NOTE — Progress Notes (Signed)
PROGRESS NOTE  DATE: 01/03/13  FACILITY: [facility]  LEVEL OF CARE: SNF  Routine Visit  CHIEF COMPLAINT:  Manage hypertension, Alzheimer's dementia and diabetes mellitus  HISTORY OF PRESENT ILLNESS:  REASSESSMENT OF ONGOING PROBLEMS:  HTN: Pt 's HTN remains stable.  Staff do not report CP, sob, DOE, pedal edema, headaches, dizziness or visual disturbances.  No complications from the meds currently being used.  Last BP : 124/53, 131/71, 110/50, 125/62, 123/56, 117/67, 123/66.  Patient is a poor historian due to dementia.  DM:pt's DM remains stable.  staff deny polyuria, polydipsia, polyphagia, changes in vision or hypoglycemic episodes.  No complications noted from the medication presently being used.  Last hemoglobin A1c is: 6.4 in 12/13.  In 6/14 hemoglobin A1c 5.6.  DEMENTIA: The dementia remaines stable and continues to function adequately in the current living environment with supervision.  The patient has had little changes in behavior. No complications noted from the medications presently being used.  Advanced.  Pt is a poor historian.  PAST MEDICAL HISTORY : Reviewed.  No changes.  CURRENT MEDICATIONS: Reviewed per [facility]  REVIEW OF SYSTEMS: Unobtainable due to dementia  PHYSICAL EXAMINATION  VS:  T 98.4     P 75    RR 18       BP 123/66     POX % 96                GENERAL: no acute distress, normal body habitus EYES: normal sclerae, normal conjunctivae, no discharge NECK: no thyromegaly, no masses, trachea midline LYMPHATICS: no cervical LAN, no supraclavicular LAN RESPIRATORY: breathing is even & unlabored, BS CTAB CARDIAC: RRR, no murmur,no extra heart sounds, no edema GI: abdomen soft, normal BS, no masses, no tenderness, no hepatomegaly, no splenomegaly PSYCHIATRIC: the patient is alert & oriented to person, affect & behavior appropriate  LABS/RADIOLOGY:  10-14 CBC normal, glucose 124 otherwise CMP normal, TSH 2.412  6/14 glucose 180, BUN 33, creatinine  1.46 otherwise CMP normal, repeat BMP showed glucose 100 otherwise BMP normal, platelets 433 otherwise CBC normal.  Repeat BMP: glc 100 ow bmp nl  11/13 glucose 112, total protein 5.9, albumin 3.3 otherwise CMP normal 10/13 CBC normal  ASSESSMENT/PLAN:  hypertension-well controlled. Diabetes mellitus-well controlled. Check hemoglobin A1c Alzheimer's dementia-advanced. Constipation-continue current laxatives. anxiety-well controlled. depression- stable.  CPT CODE: 81191

## 2013-01-09 NOTE — Progress Notes (Signed)
This encounter was created in error - please disregard.

## 2013-02-07 ENCOUNTER — Non-Acute Institutional Stay (SKILLED_NURSING_FACILITY): Payer: PRIVATE HEALTH INSURANCE | Admitting: Internal Medicine

## 2013-02-07 DIAGNOSIS — F0280 Dementia in other diseases classified elsewhere without behavioral disturbance: Secondary | ICD-10-CM

## 2013-02-07 DIAGNOSIS — K5900 Constipation, unspecified: Secondary | ICD-10-CM

## 2013-02-07 DIAGNOSIS — G309 Alzheimer's disease, unspecified: Secondary | ICD-10-CM

## 2013-02-07 DIAGNOSIS — E119 Type 2 diabetes mellitus without complications: Secondary | ICD-10-CM

## 2013-02-07 DIAGNOSIS — I10 Essential (primary) hypertension: Secondary | ICD-10-CM

## 2013-02-07 NOTE — Progress Notes (Signed)
PROGRESS NOTE  DATE: 02-07-13  FACILITY: [facility]  LEVEL OF CARE: SNF  Routine Visit  CHIEF COMPLAINT:  Manage hypertension, Alzheimer's dementia and diabetes mellitus  HISTORY OF PRESENT ILLNESS:  REASSESSMENT OF ONGOING PROBLEMS:  HTN: Pt 's HTN remains stable.  Staff do not report CP, sob, DOE, pedal edema, headaches, dizziness or visual disturbances.  No complications from the meds currently being used.  Last BP : 124/53, 131/71, 110/50, 125/62, 123/56, 117/67, 123/66.  Patient is a poor historian due to dementia.  DM:pt's DM remains stable.  staff deny polyuria, polydipsia, polyphagia, changes in vision or hypoglycemic episodes.  No complications noted from the medication presently being used.  Last hemoglobin A1c is: 6.4 in 12/13.  In 6/14 hemoglobin A1c 5.6, in 11-14 hemoglobin A1c 6.4  DEMENTIA: The dementia remaines stable and continues to function adequately in the current living environment with supervision.  The patient has had little changes in behavior. No complications noted from the medications presently being used.  Advanced.  Pt is a poor historian.  PAST MEDICAL HISTORY : Reviewed.  No changes.  [facility]  REVIEW OF SYSTEMS: Unobtainable due to dementia  PHYSICAL EXAMINATION  VS:  T 98.4     P 75    RR 18       BP 123/66     POX % 96                GENERAL: no acute distress, normal body habitus EYES: normal sclerae, normal conjunctivae, no discharge NECK: no thyromegaly, no masses, trachea midline LYMPHATICS: no cervical LAN, no supraclavicular LAN RESPIRATORY: breathing is even & unlabored, BS CTAB CARDIAC: RRR, no murmur,no extra heart sounds, no edema GI: abdomen soft, normal BS, no masses, no tenderness, no hepatomegaly, no splenomegaly PSYCHIATRIC: the patient is alert & oriented to person, affect & behavior appropriate  LABS/RADIOLOGY:  11-14 CBC normal, glucose 130, BUN 27 otherwise CMP normal  10-14 CBC  normal, glucose 124 otherwise CMP normal, TSH 2.412  6/14 glucose 180, BUN 33, creatinine 1.46 otherwise CMP normal, repeat BMP showed glucose 100 otherwise BMP normal, platelets 433 otherwise CBC normal.  Repeat BMP: glc 100 ow bmp nl  11/13 glucose 112, total protein 5.9, albumin 3.3 otherwise CMP normal 10/13 CBC normal  ASSESSMENT/PLAN:  hypertension-well controlled. Diabetes mellitus-well controlled.  Alzheimer's dementia-advanced. Constipation-continue current laxatives. anxiety-well controlled. depression- stable.  CPT CODE: [id number]

## 2013-04-06 ENCOUNTER — Encounter: Payer: Self-pay | Admitting: *Deleted

## 2013-05-21 ENCOUNTER — Non-Acute Institutional Stay (SKILLED_NURSING_FACILITY): Payer: PRIVATE HEALTH INSURANCE | Admitting: Internal Medicine

## 2013-05-21 DIAGNOSIS — E7800 Pure hypercholesterolemia, unspecified: Secondary | ICD-10-CM

## 2013-05-21 DIAGNOSIS — E119 Type 2 diabetes mellitus without complications: Secondary | ICD-10-CM

## 2013-05-21 DIAGNOSIS — I10 Essential (primary) hypertension: Secondary | ICD-10-CM

## 2013-05-21 DIAGNOSIS — F0280 Dementia in other diseases classified elsewhere without behavioral disturbance: Secondary | ICD-10-CM

## 2013-05-21 DIAGNOSIS — K5900 Constipation, unspecified: Secondary | ICD-10-CM

## 2013-05-21 DIAGNOSIS — F411 Generalized anxiety disorder: Secondary | ICD-10-CM | POA: Insufficient documentation

## 2013-05-21 DIAGNOSIS — G309 Alzheimer's disease, unspecified: Secondary | ICD-10-CM

## 2013-05-21 NOTE — Progress Notes (Signed)
         PROGRESS NOTE  DATE: 05-21-13  FACILITY: [facility]  LEVEL OF CARE: SNF  Routine Visit  CHIEF COMPLAINT:  Manage hypertension, Alzheimer's dementia and diabetes mellitus  HISTORY OF PRESENT ILLNESS:  REASSESSMENT OF ONGOING PROBLEMS:  HTN: Pt 's HTN remains stable.  Staff do not report CP, sob, DOE, pedal edema, headaches, dizziness or visual disturbances.  No complications from the meds currently being used.  Last BP : 124/53, 131/71, 110/50, 125/62, 123/56, 117/67, 123/66, 138/78.  Patient is a poor historian due to dementia.  DM:pt's DM remains stable.  staff deny polyuria, polydipsia, polyphagia, changes in vision or hypoglycemic episodes.  No complications noted from the medication presently being used.  Last hemoglobin A1c is: 6.4 in 12/13.  In 6/14 hemoglobin A1c 5.6, in 11-14 hemoglobin A1c 6.4  DEMENTIA: The dementia remaines stable and continues to function adequately in the current living environment with supervision.  The patient has had little changes in behavior. No complications noted from the medications presently being used.  Advanced.  Pt is a poor historian.  PAST MEDICAL HISTORY : Reviewed.  No changes.  [facility]: [facility]  REVIEW OF SYSTEMS: Unobtainable due to dementia  PHYSICAL EXAMINATION  VS:  See VS section                GENERAL: no acute distress, normal body habitus EYES: normal sclerae, normal conjunctivae, no discharge NECK: no thyromegaly, no masses, trachea midline LYMPHATICS: no cervical LAN, no supraclavicular LAN RESPIRATORY: breathing is even & unlabored, BS CTAB CARDIAC: RRR, no murmur,no extra heart sounds, no edema GI: abdomen soft, normal BS, no masses, no tenderness, no hepatomegaly, no splenomegaly PSYCHIATRIC: the patient is alert & oriented to person, affect & behavior appropriate  LABS/RADIOLOGY: 3/15 Hb 11.1, mcv 90.7 ow cbc nl, glc 112 ow bmp nl, TC 217, HDL 47,LDL 142, TG 139  11-14 CBC normal,  glucose 130, BUN 27 otherwise CMP normal  10-14 CBC normal, glucose 124 otherwise CMP normal, TSH 2.412  6/14 glucose 180, BUN 33, creatinine 1.46 otherwise CMP normal, repeat BMP showed glucose 100 otherwise BMP normal, platelets 433 otherwise CBC normal.  Repeat BMP: glc 100 ow bmp nl  11/13 glucose 112, total protein 5.9, albumin 3.3 otherwise CMP normal 10/13 CBC normal  ASSESSMENT/PLAN:  hypertension-well controlled. Diabetes mellitus-well controlled.  Alzheimer's dementia-advanced. Constipation-continue current laxatives. anxiety-well controlled. depression- stable. Hyperlipidemia-given advanced age & dementia will not tx.  CPT CODE: [id number]  Newton PiggGayani Y. Kerry Doryasanayaka, MD [facility] [phone]

## 2013-06-14 ENCOUNTER — Non-Acute Institutional Stay (SKILLED_NURSING_FACILITY): Payer: PRIVATE HEALTH INSURANCE | Admitting: Internal Medicine

## 2013-06-14 DIAGNOSIS — G309 Alzheimer's disease, unspecified: Secondary | ICD-10-CM

## 2013-06-14 DIAGNOSIS — K5900 Constipation, unspecified: Secondary | ICD-10-CM

## 2013-06-14 DIAGNOSIS — F0280 Dementia in other diseases classified elsewhere without behavioral disturbance: Secondary | ICD-10-CM

## 2013-06-14 DIAGNOSIS — E119 Type 2 diabetes mellitus without complications: Secondary | ICD-10-CM

## 2013-06-14 DIAGNOSIS — I10 Essential (primary) hypertension: Secondary | ICD-10-CM

## 2013-06-14 NOTE — Progress Notes (Signed)
          PROGRESS NOTE  DATE: 06-14-13  FACILITY: [facility]  LEVEL OF CARE: SNF  Routine Visit  CHIEF COMPLAINT:  Manage hypertension, Alzheimer's dementia and diabetes mellitus  HISTORY OF PRESENT ILLNESS:  REASSESSMENT OF ONGOING PROBLEMS:  HTN: Pt 's HTN remains stable.  Staff do not report CP, sob, DOE, pedal edema, headaches, dizziness or visual disturbances.  No complications from the meds currently being used.  Last BP : 124/53, 131/71, 110/50, 125/62, 123/56, 117/67, 123/66, 138/78, 142/52.  Patient is a poor historian due to dementia.  DM:pt's DM remains stable.  staff deny polyuria, polydipsia, polyphagia, changes in vision or hypoglycemic episodes.  No complications noted from the medication presently being used.  Last hemoglobin A1c is: 6.4 in 12/13.  In 6/14 hemoglobin A1c 5.6, in 11-14 hemoglobin A1c 6.4  DEMENTIA: The dementia remaines stable and continues to function adequately in the current living environment with supervision.  The patient has had little changes in behavior. No complications noted from the medications presently being used.  Advanced.  Pt is a poor historian.  PAST MEDICAL HISTORY : Reviewed.  No changes.  [facility]: [facility]  REVIEW OF SYSTEMS: Unobtainable due to dementia  PHYSICAL EXAMINATION  VS:  See VS section                GENERAL: no acute distress, normal body habitus EYES: normal sclerae, normal conjunctivae, no discharge NECK: no thyromegaly, no masses, trachea midline LYMPHATICS: no cervical LAN, no supraclavicular LAN RESPIRATORY: breathing is even & unlabored, BS CTAB CARDIAC: RRR, no murmur,no extra heart sounds, no edema GI: abdomen soft, normal BS, no masses, no tenderness, no hepatomegaly, no splenomegaly PSYCHIATRIC: the patient is alert & oriented to person, affect & behavior appropriate  LABS/RADIOLOGY: 3/15 Hb 11.1, mcv 90.7 ow cbc nl, glc 112 ow bmp nl, TC 217, HDL 47,LDL 142, TG 139  11-14  CBC normal, glucose 130, BUN 27 otherwise CMP normal  10-14 CBC normal, glucose 124 otherwise CMP normal, TSH 2.412  6/14 glucose 180, BUN 33, creatinine 1.46 otherwise CMP normal, repeat BMP showed glucose 100 otherwise BMP normal, platelets 433 otherwise CBC normal.  Repeat BMP: glc 100 ow bmp nl  11/13 glucose 112, total protein 5.9, albumin 3.3 otherwise CMP normal 10/13 CBC normal  ASSESSMENT/PLAN:  hypertension-well controlled. Diabetes mellitus-well controlled. Check hemoglobin A1c Alzheimer's dementia-advanced. Constipation-continue current laxatives. anxiety-well controlled. depression- stable. Hyperlipidemia-given advanced age & dementia will not tx. Check liver profile  CPT CODE: [id number]  Newton PiggGayani Y. Kerry Doryasanayaka, MD [facility] [phone]

## 2013-07-03 ENCOUNTER — Non-Acute Institutional Stay (SKILLED_NURSING_FACILITY): Payer: PRIVATE HEALTH INSURANCE | Admitting: Internal Medicine

## 2013-07-03 DIAGNOSIS — F0280 Dementia in other diseases classified elsewhere without behavioral disturbance: Secondary | ICD-10-CM

## 2013-07-03 DIAGNOSIS — E119 Type 2 diabetes mellitus without complications: Secondary | ICD-10-CM

## 2013-07-03 DIAGNOSIS — K5900 Constipation, unspecified: Secondary | ICD-10-CM

## 2013-07-03 DIAGNOSIS — I10 Essential (primary) hypertension: Secondary | ICD-10-CM

## 2013-07-03 DIAGNOSIS — G309 Alzheimer's disease, unspecified: Secondary | ICD-10-CM

## 2013-07-03 NOTE — Progress Notes (Signed)
           PROGRESS NOTE  DATE: 07-02-13  FACILITY: [facility]  LEVEL OF CARE: SNF  Routine Visit  CHIEF COMPLAINT:  Manage hypertension, Alzheimer's dementia and diabetes mellitus  HISTORY OF PRESENT ILLNESS:  REASSESSMENT OF ONGOING PROBLEMS:  HTN: Pt 's HTN remains stable.  Staff do not report CP, sob, DOE, pedal edema, headaches, dizziness or visual disturbances.  No complications from the meds currently being used.  Last BP : 124/53, 131/71, 110/50, 125/62, 123/56, 117/67, 123/66, 138/78, 142/52, 100/60.  Patient is a poor historian due to dementia.  DM:pt's DM remains stable.  staff deny polyuria, polydipsia, polyphagia, changes in vision or hypoglycemic episodes.  No complications noted from the medication presently being used.  Last hemoglobin A1c is: 6.4 in 12/13.  In 6/14 hemoglobin A1c 5.6, in 11-14 hemoglobin A1c 6.4  DEMENTIA: The dementia remaines stable and continues to function adequately in the current living environment with supervision.  The patient has had little changes in behavior. No complications noted from the medications presently being used.  Advanced.  Pt is a poor historian.  PAST MEDICAL HISTORY : Reviewed.  No changes.  CURRENT MEDICATIONS: Reviewed per [facility]  REVIEW OF SYSTEMS: Unobtainable due to dementia  PHYSICAL EXAMINATION  VS:  See VS section                GENERAL: no acute distress, normal body habitus EYES: normal sclerae, normal conjunctivae, no discharge NECK: no thyromegaly, no masses, trachea midline LYMPHATICS: no cervical LAN, no supraclavicular LAN RESPIRATORY: breathing is even & unlabored, BS CTAB CARDIAC: RRR, no murmur,no extra heart sounds, no edema GI: abdomen soft, normal BS, no masses, no tenderness, no hepatomegaly, no splenomegaly PSYCHIATRIC: the patient is alert & disoriented , affect & behavior appropriate  LABS/RADIOLOGY: 5-15 total protein 5.9, albumin 3.3 otherwise liver profile normal 3/15 Hb 11.1, mcv 90.7  ow cbc nl, glc 112 ow bmp nl, TC 217, HDL 47,LDL 142, TG 139  11-14 CBC normal, glucose 130, BUN 27 otherwise CMP normal  10-14 CBC normal, glucose 124 otherwise CMP normal, TSH 2.412  6/14 glucose 180, BUN 33, creatinine 1.46 otherwise CMP normal, repeat BMP showed glucose 100 otherwise BMP normal, platelets 433 otherwise CBC normal.  Repeat BMP: glc 100 ow bmp nl  11/13 glucose 112, total protein 5.9, albumin 3.3 otherwise CMP normal 10/13 CBC normal  ASSESSMENT/PLAN:  hypertension-well controlled. Diabetes mellitus-well controlled. Check hemoglobin A1c Alzheimer's dementia-advanced. Constipation-continue current laxatives. anxiety-well controlled. depression- stable. Hyperlipidemia-given advanced age & dementia will not tx.  CPT CODE: 09323  Newton Pigg. Kerry Dory, MD [facility] [phone]

## 2013-07-09 ENCOUNTER — Encounter: Payer: Self-pay | Admitting: *Deleted

## 2013-08-16 ENCOUNTER — Non-Acute Institutional Stay (SKILLED_NURSING_FACILITY): Payer: PRIVATE HEALTH INSURANCE | Admitting: Internal Medicine

## 2013-08-16 DIAGNOSIS — I10 Essential (primary) hypertension: Secondary | ICD-10-CM

## 2013-08-16 DIAGNOSIS — G309 Alzheimer's disease, unspecified: Secondary | ICD-10-CM

## 2013-08-16 DIAGNOSIS — E119 Type 2 diabetes mellitus without complications: Secondary | ICD-10-CM

## 2013-08-16 DIAGNOSIS — K5900 Constipation, unspecified: Secondary | ICD-10-CM

## 2013-08-16 DIAGNOSIS — F0280 Dementia in other diseases classified elsewhere without behavioral disturbance: Secondary | ICD-10-CM

## 2013-08-17 NOTE — Progress Notes (Signed)
         PROGRESS NOTE  DATE: 08-16-13  FACILITY: [facility]  LEVEL OF CARE: SNF  Routine Visit  CHIEF COMPLAINT:  Manage hypertension, Alzheimer's dementia and diabetes mellitus  HISTORY OF PRESENT ILLNESS:  REASSESSMENT OF ONGOING PROBLEMS:  HTN: Pt 's HTN remains stable.  Staff do not report CP, sob, DOE, pedal edema, headaches, dizziness or visual disturbances.  No complications from the meds currently being used.  Last BP : 124/53, 131/71, 110/50, 125/62, 123/56, 117/67, 123/66, 138/78, 142/52, 100/60, 128/64.  Patient is a poor historian due to dementia.  DM:pt's DM remains stable.  staff deny polyuria, polydipsia, polyphagia, changes in vision or hypoglycemic episodes.  No complications noted from the medication presently being used.  Last hemoglobin A1c is: 6.4 in 12/13.  In 6/14 hemoglobin A1c 5.6, in 11-14 hemoglobin A1c 6.4, 6-15 hemoglobin A1c 6.9  DEMENTIA: The dementia remaines stable and continues to function adequately in the current living environment with supervision.  The patient has had little changes in behavior. No complications noted from the medications presently being used.  Advanced.  Pt is a poor historian.  PAST MEDICAL HISTORY : Reviewed.  No changes.  CURRENT MEDICATIONS: [facility]  REVIEW OF SYSTEMS: Unobtainable due to dementia  PHYSICAL EXAMINATION  VS:  See VS section                GENERAL: no acute distress, normal body habitus EYES: normal sclerae, normal conjunctivae, no discharge NECK: no thyromegaly, no masses, trachea midline LYMPHATICS: no cervical LAN, no supraclavicular LAN RESPIRATORY: breathing is even & unlabored, BS CTAB CARDIAC: RRR, no murmur,no extra heart sounds, no edema GI: abdomen soft, normal BS, no masses, no tenderness, no hepatomegaly, no splenomegaly PSYCHIATRIC: the patient is alert & disoriented , affect & behavior appropriate  LABS/RADIOLOGY: X.-15 CO2 32, glucose 118 otherwise CMP normal, hemoglobin  10.6, MCV 92.3 otherwise CBC normal, TSH 2.942, total cholesterol 229, HDL 49, LDL 146, triglycerides 172 5-15 total protein 5.9, albumin 3.3 otherwise liver profile normal 3/15 Hb 11.1, mcv 90.7 ow cbc nl, glc 112 ow bmp nl, TC 217, HDL 47,LDL 142, TG 139  11-14 CBC normal, glucose 130, BUN 27 otherwise CMP normal  10-14 CBC normal, glucose 124 otherwise CMP normal, TSH 2.412  6/14 glucose 180, BUN 33, creatinine 1.46 otherwise CMP normal, repeat BMP showed glucose 100 otherwise BMP normal, platelets 433 otherwise CBC normal.  Repeat BMP: glc 100 ow bmp nl  11/13 glucose 112, total protein 5.9, albumin 3.3 otherwise CMP normal 10/13 CBC normal  ASSESSMENT/PLAN:  hypertension-well controlled. Diabetes mellitus-well controlled.  Alzheimer's dementia-advanced. Constipation-continue current laxatives. anxiety-well controlled. depression- stable. Hyperlipidemia-Zocor was started  CPT CODE: [id number]  Newton PiggGayani Y. Kerry Doryasanayaka, MD [facility] [phone]

## 2013-09-27 ENCOUNTER — Non-Acute Institutional Stay (SKILLED_NURSING_FACILITY): Payer: PRIVATE HEALTH INSURANCE | Admitting: Internal Medicine

## 2013-09-27 DIAGNOSIS — F0280 Dementia in other diseases classified elsewhere without behavioral disturbance: Secondary | ICD-10-CM

## 2013-09-27 DIAGNOSIS — I10 Essential (primary) hypertension: Secondary | ICD-10-CM

## 2013-09-27 DIAGNOSIS — E119 Type 2 diabetes mellitus without complications: Secondary | ICD-10-CM

## 2013-09-27 DIAGNOSIS — G309 Alzheimer's disease, unspecified: Secondary | ICD-10-CM

## 2013-09-27 DIAGNOSIS — K5900 Constipation, unspecified: Secondary | ICD-10-CM

## 2013-10-01 NOTE — Progress Notes (Signed)
         PROGRESS NOTE  DATE: 09-27-13  FACILITY: [facility]  LEVEL OF CARE: SNF  Routine Visit  CHIEF COMPLAINT:  Manage hypertension, Alzheimer's dementia and diabetes mellitus  HISTORY OF PRESENT ILLNESS:  REASSESSMENT OF ONGOING PROBLEMS:  HTN: Pt 's HTN remains stable.  Staff do not report CP, sob, DOE, pedal edema, headaches, dizziness or visual disturbances.  No complications from the meds currently being used.  Last BP : 124/53, 131/71, 110/50, 125/62, 123/56, 117/67, 123/66, 138/78, 142/52, 100/60, 128/64 123/55.  Patient is a poor historian due to dementia.  DM:pt's DM remains stable.  staff deny polyuria, polydipsia, polyphagia, changes in vision or hypoglycemic episodes.  No complications noted from the medication presently being used.  Last hemoglobin A1c is: 6.4 in 12/13.  In 6/14 hemoglobin A1c 5.6, in 11-14 hemoglobin A1c 6.4, 6-15 hemoglobin A1c 6.9  DEMENTIA: The dementia remaines stable and continues to function adequately in the current living environment with supervision.  The patient has had little changes in behavior. No complications noted from the medications presently being used.  Advanced.  Pt is a poor historian.  PAST MEDICAL HISTORY : Reviewed.  No changes.  CURRENT MEDICATIONS: Reviewed per [facility]  REVIEW OF SYSTEMS: Unobtainable due to dementia  PHYSICAL EXAMINATION  VS:  See VS section                GENERAL: no acute distress, normal body habitus EYES: normal sclerae, normal conjunctivae, no discharge NECK: no thyromegaly, no masses, trachea midline LYMPHATICS: no cervical LAN, no supraclavicular LAN RESPIRATORY: breathing is even & unlabored, BS CTAB CARDIAC: RRR, no murmur,no extra heart sounds, no edema GI: abdomen soft, normal BS, no masses, no tenderness, no hepatomegaly, no splenomegaly PSYCHIATRIC: the patient is alert & disoriented , affect & behavior appropriate  LABS/RADIOLOGY: 6-15 CO2 32, glucose 118 otherwise CMP normal,  hemoglobin 10.6, MCV 92.3 otherwise CBC normal, TSH 2.942, total cholesterol 229, HDL 49, LDL 146, triglycerides 172 5-15 total protein 5.9, albumin 3.3 otherwise liver profile normal 3/15 Hb 11.1, mcv 90.7 ow cbc nl, glc 112 ow bmp nl, TC 217, HDL 47,LDL 142, TG 139  11-14 CBC normal, glucose 130, BUN 27 otherwise CMP normal  10-14 CBC normal, glucose 124 otherwise CMP normal, TSH 2.412  6/14 glucose 180, BUN 33, creatinine 1.46 otherwise CMP normal, repeat BMP showed glucose 100 otherwise BMP normal, platelets 433 otherwise CBC normal.  Repeat BMP: glc 100 ow bmp nl  11/13 glucose 112, total protein 5.9, albumin 3.3 otherwise CMP normal 10/13 CBC normal  ASSESSMENT/PLAN:  hypertension-well controlled. Diabetes mellitus-well controlled.  Alzheimer's dementia-advanced. Constipation-continue current laxatives. anxiety-well controlled. depression- stable. Hyperlipidemia-Zocor was started  CPT CODE: 78295  Newton Pigg. Kerry Dory, MD [facility] [phone]

## 2013-10-16 ENCOUNTER — Non-Acute Institutional Stay (SKILLED_NURSING_FACILITY): Payer: PRIVATE HEALTH INSURANCE | Admitting: Internal Medicine

## 2013-10-16 DIAGNOSIS — E119 Type 2 diabetes mellitus without complications: Secondary | ICD-10-CM

## 2013-10-16 DIAGNOSIS — G309 Alzheimer's disease, unspecified: Secondary | ICD-10-CM

## 2013-10-16 DIAGNOSIS — F0280 Dementia in other diseases classified elsewhere without behavioral disturbance: Secondary | ICD-10-CM

## 2013-10-16 DIAGNOSIS — I10 Essential (primary) hypertension: Secondary | ICD-10-CM

## 2013-10-16 DIAGNOSIS — K5900 Constipation, unspecified: Secondary | ICD-10-CM

## 2013-10-16 NOTE — Progress Notes (Signed)
         PROGRESS NOTE  DATE: 10-16-13  FACILITY: [facility]  LEVEL OF CARE: SNF  Routine Visit  CHIEF COMPLAINT:  Manage hypertension, Alzheimer's dementia and diabetes mellitus  HISTORY OF PRESENT ILLNESS:  REASSESSMENT OF ONGOING PROBLEMS:  HTN: Pt 's HTN remains stable.  Staff do not report CP, sob, DOE, pedal edema, headaches, dizziness or visual disturbances.  No complications from the meds currently being used.  Last BP : 124/53, 131/71, 110/50, 125/62, 123/56, 117/67, 123/66, 138/78, 142/52, 100/60, 128/64 123/55, 133/55.  Patient is a poor historian due to dementia.  DM:pt's DM remains stable.  staff deny polyuria, polydipsia, polyphagia, changes in vision or hypoglycemic episodes.  No complications noted from the medication presently being used.  Last hemoglobin A1c is: 6.4 in 12/13.  In 6/14 hemoglobin A1c 5.6, in 11-14 hemoglobin A1c 6.4, 6-15 hemoglobin A1c 6.9, in 9-15 hemoglobin A1c 7  DEMENTIA: The dementia remaines stable and continues to function adequately in the current living environment with supervision.  The patient has had little changes in behavior. No complications noted from the medications presently being used.  Advanced.  Pt is a poor historian.  PAST MEDICAL HISTORY : Reviewed.  No changes.  CURRENT MEDICATIONS: Reviewed per [facility]  REVIEW OF SYSTEMS: Unobtainable due to dementia  PHYSICAL EXAMINATION  VS:  See VS section                GENERAL: no acute distress, normal body habitus EYES: normal sclerae, normal conjunctivae, no discharge NECK: no thyromegaly, no masses, trachea midline LYMPHATICS: no cervical LAN, no supraclavicular LAN RESPIRATORY: breathing is even & unlabored, BS CTAB CARDIAC: RRR, no murmur,no extra heart sounds, no edema GI: abdomen soft, normal BS, no masses, no tenderness, no hepatomegaly, no splenomegaly PSYCHIATRIC: the patient is alert & disoriented , affect & behavior appropriate  LABS/RADIOLOGY: 6-15 CO2 32, glucose  118 otherwise CMP normal, hemoglobin 10.6, MCV 92.3 otherwise CBC normal, TSH 2.942, total cholesterol 229, HDL 49, LDL 146, triglycerides 172 5-15 total protein 5.9, albumin 3.3 otherwise liver profile normal 3/15 Hb 11.1, mcv 90.7 ow cbc nl, glc 112 ow bmp nl, TC 217, HDL 47,LDL 142, TG 139  11-14 CBC normal, glucose 130, BUN 27 otherwise CMP normal  10-14 CBC normal, glucose 124 otherwise CMP normal, TSH 2.412  6/14 glucose 180, BUN 33, creatinine 1.46 otherwise CMP normal, repeat BMP showed glucose 100 otherwise BMP normal, platelets 433 otherwise CBC normal.  Repeat BMP: glc 100 ow bmp nl  11/13 glucose 112, total protein 5.9, albumin 3.3 otherwise CMP normal 10/13 CBC normal  ASSESSMENT/PLAN:  hypertension-well controlled. Diabetes mellitus-well controlled.  Alzheimer's dementia-advanced. Constipation-continue current laxatives. anxiety-well controlled. depression- stable. Hyperlipidemia-on Zocor  CPT CODE: 16109  Newton Pigg. Kerry Dory, MD [facility] [phone]

## 2013-11-15 ENCOUNTER — Non-Acute Institutional Stay (SKILLED_NURSING_FACILITY): Payer: PRIVATE HEALTH INSURANCE | Admitting: Internal Medicine

## 2013-11-15 DIAGNOSIS — F0280 Dementia in other diseases classified elsewhere without behavioral disturbance: Secondary | ICD-10-CM

## 2013-11-15 DIAGNOSIS — E119 Type 2 diabetes mellitus without complications: Secondary | ICD-10-CM

## 2013-11-15 DIAGNOSIS — K5900 Constipation, unspecified: Secondary | ICD-10-CM

## 2013-11-15 DIAGNOSIS — G309 Alzheimer's disease, unspecified: Secondary | ICD-10-CM

## 2013-11-15 DIAGNOSIS — I10 Essential (primary) hypertension: Secondary | ICD-10-CM

## 2013-11-17 DIAGNOSIS — E119 Type 2 diabetes mellitus without complications: Secondary | ICD-10-CM | POA: Insufficient documentation

## 2013-11-17 NOTE — Progress Notes (Signed)
         PROGRESS NOTE  DATE: 11-15-13  FACILITY: [facility]  LEVEL OF CARE: SNF  Routine Visit  CHIEF COMPLAINT:  Manage hypertension, Alzheimer's dementia and diabetes mellitus  HISTORY OF PRESENT ILLNESS:  REASSESSMENT OF ONGOING PROBLEMS:  HTN: Pt 's HTN remains stable.  Staff do not report CP, sob, DOE, pedal edema, headaches, dizziness or visual disturbances.  No complications from the meds currently being used.  Last BP : 124/53, 131/71, 110/50, 125/62, 123/56, 117/67, 123/66, 138/78, 142/52, 100/60, 128/64 123/55, 133/55, 120/55.  Patient is a poor historian due to dementia.  DM:pt's DM remains stable.  staff deny polyuria, polydipsia, polyphagia, changes in vision or hypoglycemic episodes.  No complications noted from the medication presently being used.  Last hemoglobin A1c is: 6.4 in 12/13.  In 6/14 hemoglobin A1c 5.6, in 11-14 hemoglobin A1c 6.4, 6-15 hemoglobin A1c 6.9, in 9-15 hemoglobin A1c 7  DEMENTIA: The dementia remaines stable and continues to function adequately in the current living environment with supervision.  The patient has had little changes in behavior. No complications noted from the medications presently being used.  Advanced.  Pt is a poor historian.  PAST MEDICAL HISTORY : Reviewed.  No changes.  CURRENT MEDICATIONS: [facility]  REVIEW OF SYSTEMS: Unobtainable due to dementia  PHYSICAL EXAMINATION  VS:  See VS section                GENERAL: no acute distress, normal body habitus EYES: normal sclerae, normal conjunctivae, no discharge NECK: no thyromegaly, no masses, trachea midline LYMPHATICS: no cervical LAN, no supraclavicular LAN RESPIRATORY: breathing is even & unlabored, BS CTAB CARDIAC: RRR, no murmur,no extra heart sounds, no edema GI: abdomen soft, normal BS, no masses, no tenderness, no hepatomegaly, no splenomegaly PSYCHIATRIC: the patient is alert & disoriented , affect & behavior appropriate  LABS/RADIOLOGY: 6-15 CO2 32,  glucose 118 otherwise CMP normal, hemoglobin 10.6, MCV 92.3 otherwise CBC normal, TSH 2.942, total cholesterol 229, HDL 49, LDL 146, triglycerides 172 5-15 total protein 5.9, albumin 3.3 otherwise liver profile normal 3/15 Hb 11.1, mcv 90.7 ow cbc nl, glc 112 ow bmp nl, TC 217, HDL 47,LDL 142, TG 139  11-14 CBC normal, glucose 130, BUN 27 otherwise CMP normal  10-14 CBC normal, glucose 124 otherwise CMP normal, TSH 2.412  6/14 glucose 180, BUN 33, creatinine 1.46 otherwise CMP normal, repeat BMP showed glucose 100 otherwise BMP normal, platelets 433 otherwise CBC normal.  Repeat BMP: glc 100 ow bmp nl  11/13 glucose 112, total protein 5.9, albumin 3.3 otherwise CMP normal 10/13 CBC normal  ASSESSMENT/PLAN:  hypertension-well controlled. Diabetes mellitus-well controlled.  Alzheimer's dementia-advanced. Constipation-continue current laxatives. anxiety-well controlled. depression- stable. Hyperlipidemia-on Zocor  CPT CODE: [id number]  Newton PiggGayani Y. Kerry Doryasanayaka, MD [facility] [phone]

## 2013-11-20 LAB — HM DIABETES EYE EXAM

## 2014-02-11 LAB — CBC AND DIFFERENTIAL
HCT: 40 % (ref 36–46)
HEMATOCRIT: 40 % (ref 36–46)
HEMOGLOBIN: 12.3 g/dL (ref 12.0–16.0)
HEMOGLOBIN: 12.3 g/dL (ref 12.0–16.0)
Platelets: 285 10*3/uL (ref 150–399)
Platelets: 285 10*3/uL (ref 150–399)
WBC: 6 10*3/mL
WBC: 6 10^3/mL

## 2014-02-11 LAB — BASIC METABOLIC PANEL
BUN: 18 mg/dL (ref 4–21)
Creatinine: 1 mg/dL (ref <=1.1)
GLUCOSE: 155 mg/dL

## 2014-02-27 ENCOUNTER — Non-Acute Institutional Stay (SKILLED_NURSING_FACILITY): Payer: Medicare Other | Admitting: Internal Medicine

## 2014-02-27 DIAGNOSIS — E785 Hyperlipidemia, unspecified: Secondary | ICD-10-CM

## 2014-02-27 DIAGNOSIS — I10 Essential (primary) hypertension: Secondary | ICD-10-CM

## 2014-02-27 DIAGNOSIS — K5901 Slow transit constipation: Secondary | ICD-10-CM | POA: Diagnosis not present

## 2014-02-27 DIAGNOSIS — M19031 Primary osteoarthritis, right wrist: Secondary | ICD-10-CM

## 2014-02-27 NOTE — Progress Notes (Signed)
Patient ID: Nelle DonDoris T Bobb, female   DOB: 13-Jul-1919, 81 y.o.   MRN: [id number]  [facility]: [facility]  Chief complaint: medical management of chronic issues  Allergies: NKDA  Code status: full code  HPI 81 y/o female patient is here for long term care. She has dementia and is blind in right eye. She recently had xray of her wrist for swelling and bruising and it showed osteoarthritis changes. She is now on oscal and vitamin d supplement with tylenol. She is tolerating zocor well.   HPI and ROS limited with her dementia.  ROS See hpi  Past medical history reviewed  Medication [facility]. [facility]  Physical exam BP 113/69 mmHg  Pulse 79  Temp(Src) 97 F (36.1 C)  Resp 18  SpO2 96%  General- elderly female in no acute distress, pleasantly confused Head- atraumatic, normocephalic Eyes- no pallor, no icterus Neck- no lymphadenopathy Cardiovascular- normal s1,s2, systolic murmurs, diminished pedal pulses, trace edema upto ankle Respiratory- bilateral clear to auscultation, no wheeze, no rhonchi, no crackles Abdomen- bowel sounds present, soft, non tender Musculoskeletal- able to move all 4 extremities, LE weakness > upper extremities, non ambulatory, needs assistance with transfer, normal ROM in right wrist Neurological- no focal deficit Psychiatry- pleasantly confused  Labs 02/11/14 t.chol 267, tg 185, ldl 176   Assessment/plan  Hyperlipidemia Reviewed lipid panel. zocor increased to 10 mg daily, tolerating it well.   Right wrist OA Stable, continue tylenol 1000 mg qhs with oscal and vitamin d supplement  Constipation Continue colace, senokot and metamucil for now, stable, monitor  HTN  Stable bp reading, continue lisinopril 2.5 mg daily

## 2014-03-21 LAB — HEMOGLOBIN A1C: HEMOGLOBIN A1C: 7.7 % — AB (ref 4.0–6.0)

## 2014-05-02 ENCOUNTER — Non-Acute Institutional Stay (SKILLED_NURSING_FACILITY): Payer: Medicare Other | Admitting: Internal Medicine

## 2014-05-02 DIAGNOSIS — M150 Primary generalized (osteo)arthritis: Secondary | ICD-10-CM | POA: Diagnosis not present

## 2014-05-02 DIAGNOSIS — F0390 Unspecified dementia without behavioral disturbance: Secondary | ICD-10-CM

## 2014-05-02 DIAGNOSIS — E785 Hyperlipidemia, unspecified: Secondary | ICD-10-CM | POA: Diagnosis not present

## 2014-05-02 DIAGNOSIS — M810 Age-related osteoporosis without current pathological fracture: Secondary | ICD-10-CM | POA: Insufficient documentation

## 2014-05-02 DIAGNOSIS — M159 Polyosteoarthritis, unspecified: Secondary | ICD-10-CM

## 2014-05-02 NOTE — Progress Notes (Signed)
Patient ID: Suzanne Bell, female   DOB: [birth year], 81 y.o.   MRN: [id number]    [facility]: [facility]  Chief complaint: medical management of chronic issues  Allergies: NKDA  Code status: full code  HPI 81 y/o female patient is here for long term care. She is seen today for routine visit. She has history of HLD,OA, osteoporosis, HTN among others. She is seen in her room today with her daughter present. She needs cueing with meals. No falls reported. No new skin concerns. No concern from patient. HPI and ROS limited with her dementia.  ROS See hpi  Past medical history reviewed    Medication List       This list is accurate as of: 05/02/14  5:48 PM.  Always use your most recent med list.               acetaminophen 325 MG tablet  Commonly known as:  TYLENOL  Take 1,000 mg by mouth at bedtime. For fever     aspirin 81 MG tablet  Take 81 mg by mouth daily.     cholecalciferol 1000 UNITS tablet  Commonly known as:  VITAMIN D  Take 1,000 Units by mouth daily.     docusate 50 MG/5ML liquid  Commonly known as:  COLACE  Take 100 mg by mouth daily. For constipation     donepezil 10 MG tablet  Commonly known as:  ARICEPT  Take 10 mg by mouth at bedtime. For alzheimer's disease     lisinopril 2.5 MG tablet  Commonly known as:  PRINIVIL,ZESTRIL  Take 2.5 mg by mouth daily. For HTN     memantine 10 MG tablet  Commonly known as:  NAMENDA  Take 10 mg by mouth 2 (two) times daily. For dementia     OSCAL 500/200 D-3 500-200 MG-UNIT per tablet  Generic drug:  calcium-vitamin D  Take 1 tablet by mouth 2 (two) times daily. For osteoporosis     psyllium 58.6 % packet  Commonly known as:  METAMUCIL  Take 1 packet by mouth daily. Mix in 6-8 oz of liquid     senna 8.6 MG tablet  Commonly known as:  SENOKOT  Take 2 tablets by mouth at bedtime. For constipation     simvastatin 10 MG tablet  Commonly known as:  ZOCOR  Take 10 mg by  mouth daily.       Physical exam BP 142/68 mmHg  Pulse 82  Temp(Src) 97.6 F (36.4 C)  Resp 18  SpO2 98%  General- elderly female in no acute distress, pleasantly confused Head- atraumatic, normocephalic Eyes- no pallor, no icterus Neck- no lymphadenopathy Cardiovascular- normal s1,s2, systolic murmurs, diminished pedal pulses, trace edema upto ankle Respiratory- bilateral clear to auscultation, no wheeze, no rhonchi, no crackles Abdomen- bowel sounds present, soft, non tender Musculoskeletal- able to move all 4 extremities, LE weakness > upper extremities, non ambulatory, needs assistance with transfer  Neurological- no focal deficit Psychiatry- alert and oriented to person, normal affect  Labs 02/11/14 t.chol 267, tg 185, ldl 176 03/21/14 a1c 7.7  Assessment/plan  Hyperlipidemia Continue atorvastatin 20 mg daily, lipid panel 1/16 reviewed. F/u in 6 months  Senile osteoporosis Continue vitamin d and oscal, fall precautions  OA Stable, continue tylenol 500 mg bid  Dementia without behavioral disturbance Stable, on namenda 10 mg bid and aricept 10 mg daily, change to namzeric 28-10 once a day to reduce pill burden. reassess

## 2014-05-21 LAB — LIPID PANEL
CHOLESTEROL: 138 mg/dL (ref 0–200)
HDL: 48 mg/dL (ref 35–70)
LDL CALC: 63 mg/dL
TRIGLYCERIDES: 136 mg/dL (ref 40–160)

## 2014-05-21 LAB — HEPATIC FUNCTION PANEL
ALT: 15 U/L (ref 7–35)
AST: 19 U/L (ref 13–35)
Alkaline Phosphatase: 46 U/L (ref 25–125)
Bilirubin, Total: 0.4 mg/dL

## 2014-05-21 LAB — BASIC METABOLIC PANEL
BUN: 15 mg/dL (ref 4–21)
CREATININE: 1 mg/dL (ref 0.5–1.1)
Glucose: 168 mg/dL
POTASSIUM: 3.8 mmol/L (ref 3.4–5.3)
SODIUM: 141 mmol/L (ref 137–147)

## 2014-05-24 ENCOUNTER — Non-Acute Institutional Stay (SKILLED_NURSING_FACILITY): Payer: Medicare Other | Admitting: Internal Medicine

## 2014-05-24 DIAGNOSIS — K5901 Slow transit constipation: Secondary | ICD-10-CM | POA: Diagnosis not present

## 2014-05-24 DIAGNOSIS — M810 Age-related osteoporosis without current pathological fracture: Secondary | ICD-10-CM | POA: Diagnosis not present

## 2014-05-24 DIAGNOSIS — I10 Essential (primary) hypertension: Secondary | ICD-10-CM | POA: Diagnosis not present

## 2014-05-24 NOTE — Progress Notes (Signed)
Patient ID: Suzanne Bell, female   DOB: 16-Oct-1919, 81 y.o.   MRN: [id number]    [facility]  Chief complaint: medical management of chronic issues  Allergies: NKDA  Code status: full code  HPI 81 y/o female patient is seen today for routine visit in her room. No new concern from staff. She is blind in right eye. No falls reported. No new skin concerns. No concern from patient. HPI and ROS limited with her dementia.  ROS See hpi  Past medical history reviewed  Medication reviewed. [facility]  Physical exam BP 139/63 mmHg  Pulse 69  Temp(Src) 97 F (36.1 C)  Resp 18  Wt 172 lb 9.6 oz (78.291 kg)  SpO2 98%  General- elderly female in no acute distress, pleasantly confused Head- atraumatic, normocephalic Eyes- no pallor, no icterus Neck- no lymphadenopathy Cardiovascular- normal s1,s2, systolic murmurs, diminished pedal pulses, trace edema upto ankle Respiratory- bilateral clear to auscultation, no wheeze, no rhonchi, no crackles Abdomen- bowel sounds present, soft, non tender Musculoskeletal- able to move all 4 extremities, LE weakness > upper extremities, non ambulatory, needs assistance with transfer   Neurological- no focal deficit Psychiatry- alert and oriented to person, normal affect  Labs 02/11/14 t.chol 267, tg 185, ldl 176 03/21/14 a1c 7.7  Assessment/plan  Senile osteoporosis No known recent fracture. Continue oscal with vitamin d supplement. Fall precautions  Constipation Stable, continue colace and metamucil with senokot for now  Hypertension bp reading stable, continue lisinopril 2.5 mg daily with aspirin

## 2014-06-20 ENCOUNTER — Non-Acute Institutional Stay (SKILLED_NURSING_FACILITY): Payer: Medicare Other | Admitting: Internal Medicine

## 2014-06-20 DIAGNOSIS — E46 Unspecified protein-calorie malnutrition: Secondary | ICD-10-CM | POA: Diagnosis not present

## 2014-06-20 DIAGNOSIS — F0280 Dementia in other diseases classified elsewhere without behavioral disturbance: Secondary | ICD-10-CM

## 2014-06-20 DIAGNOSIS — H04123 Dry eye syndrome of bilateral lacrimal glands: Secondary | ICD-10-CM

## 2014-06-20 DIAGNOSIS — G309 Alzheimer's disease, unspecified: Secondary | ICD-10-CM | POA: Diagnosis not present

## 2014-06-20 DIAGNOSIS — E119 Type 2 diabetes mellitus without complications: Secondary | ICD-10-CM | POA: Insufficient documentation

## 2014-06-20 DIAGNOSIS — E785 Hyperlipidemia, unspecified: Secondary | ICD-10-CM

## 2014-06-20 DIAGNOSIS — H04129 Dry eye syndrome of unspecified lacrimal gland: Secondary | ICD-10-CM | POA: Insufficient documentation

## 2014-06-20 NOTE — Progress Notes (Signed)
Patient ID: Suzanne Bell, female   DOB: 1919-03-20, 81 y.o.   MRN: [id number]     [facility]  Chief complaint: medical management of chronic issues  Allergies: NKDA  Code status: full code  HPI 81 y/o female patient is seen today for routine visit in her room. No new concern from staff. She is blind in right eye. No falls reported. Lost 4 lbs since last visit. No new skin concerns. No concern from patient. HPI and ROS limited with her dementia.  ROS See hpi  Past medical history reviewed  Medication [facility]. [facility]  Physical exam BP 118/60 mmHg  Pulse 66  Temp(Src) 97 F (36.1 C)  Resp 18  Wt 168 lb 3.2 oz (76.295 kg)  SpO2 97%  Wt Readings from Last 3 Encounters:  06/20/14 168 lb 3.2 oz (76.295 kg)  05/24/14 172 lb 9.6 oz (78.291 kg)   General- elderly female in no acute distress, pleasantly confused Head- atraumatic, normocephalic Eyes- no pallor, no icterus Neck- no lymphadenopathy Cardiovascular- normal s1,s2, systolic murmurs, diminished pedal pulses, trace edema upto ankle Respiratory- bilateral clear to auscultation, no wheeze, no rhonchi, no crackles Abdomen- bowel sounds present, soft, non tender Musculoskeletal- able to move all 4 extremities, LE weakness > upper extremities, non ambulatory, needs assistance with transfer   Neurological- no focal deficit Psychiatry- alert and oriented to person, normal affect  Labs 02/11/14 t.chol 267, tg 185, ldl 176 03/21/14 a1c 7.7 05/21/14 na 141, k 3.8, bun 15, cr 1.04, glu 168, t.chol 138, ldl 63, hdl 48, tg 136  Assessment/plan  alzheimer's dementia Her namenda and aricept were discontinued last visit and she was started on namzaric,tolerating well. Continue this. Continue assistance with ADLs, fall precautions. Fall prophylaxis.   Protein calorie malnutrition Lost 4 lbs over last month. Monitor weight and po intake. Assistance with meals. Continue fortified food   And puree diet. Decline anticipated with her dementia. Pressure ulcer prophylaxis. Check prealbumin level and get RD consult  Tear film insufficiency Stable, continue systane eye drops  Hyperlipidemia Reviewed lipid panel from 4/16. decrease lipitor to 10 mg daily. Check lipid panel yearly  Dm type 2 Goal 1c < 8, check a1c. Off all meds with last a1c 7.7  Oneal GroutMAHIMA Aubriella Perezgarcia, MD  [facility] [phone] (Monday-Friday 8 am - 5 pm) [phone] (afterhours)

## 2014-06-22 LAB — HEMOGLOBIN A1C: Hgb A1c MFr Bld: 8.1 % — AB (ref 4.0–6.0)

## 2014-07-17 ENCOUNTER — Encounter: Payer: Self-pay | Admitting: Internal Medicine

## 2014-07-17 ENCOUNTER — Non-Acute Institutional Stay (SKILLED_NURSING_FACILITY): Payer: Medicare Other | Admitting: Internal Medicine

## 2014-07-17 DIAGNOSIS — I10 Essential (primary) hypertension: Secondary | ICD-10-CM

## 2014-07-17 DIAGNOSIS — E1136 Type 2 diabetes mellitus with diabetic cataract: Secondary | ICD-10-CM | POA: Diagnosis not present

## 2014-07-17 DIAGNOSIS — H2522 Age-related cataract, morgagnian type, left eye: Secondary | ICD-10-CM

## 2014-07-17 DIAGNOSIS — F0150 Vascular dementia without behavioral disturbance: Secondary | ICD-10-CM

## 2014-07-17 DIAGNOSIS — E1165 Type 2 diabetes mellitus with hyperglycemia: Secondary | ICD-10-CM

## 2014-07-17 DIAGNOSIS — IMO0002 Reserved for concepts with insufficient information to code with codable children: Secondary | ICD-10-CM

## 2014-07-17 DIAGNOSIS — H2520 Age-related cataract, morgagnian type, unspecified eye: Secondary | ICD-10-CM | POA: Insufficient documentation

## 2014-07-17 NOTE — Progress Notes (Signed)
Patient ID: AKAILAH BESECKER, female   DOB: [birth year], 81 y.o.   MRN: [id number]    [facility]  Code Status: Full Code   Chief Complaint  Patient presents with  . Medical Management of Chronic Issues    Routine Visit    Allergies: NKDA  Code status: full code  HPI 81 y/o female patient is seen today for routine visit. She is feeding her self in dining area, sitting on a wheelchair. She is in no distress. participates minimally in conversation by answering yes/ no to some question. Needs cueing with feeding.  No new concern from staff. She is blind in right eye. No falls reported. HPI and ROS limited with her dementia.  ROS See hpi No falls reported No new skin concern No behavioral concerns  Past medical history reviewed    Medication List       This list is accurate as of: 07/17/14  1:30 PM.  Always use your most recent med list.               acetaminophen 325 MG tablet  Commonly known as:  TYLENOL  Take 1,000 mg by mouth at bedtime. For fever     AMBULATORY NON FORMULARY MEDICATION  Medication Name: Med Pass 90 mL twice a day for nutritional support     aspirin 81 MG tablet  Take 81 mg by mouth daily.     atorvastatin 10 MG tablet  Commonly known as:  LIPITOR  Take 10 mg by mouth daily. For High Cholesterol     cholecalciferol 1000 UNITS tablet  Commonly known as:  VITAMIN D  Take 1,000 Units by mouth daily.     docusate 50 MG/5ML liquid  Commonly known as:  COLACE  Take 100 mg by mouth daily. For constipation     lisinopril 2.5 MG tablet  Commonly known as:  PRINIVIL,ZESTRIL  Take 2.5 mg by mouth daily. For HTN     Memantine HCl-Donepezil HCl 28-10 MG Cp24  Take 1 capsule by mouth daily.     OSCAL 500/200 D-3 500-200 MG-UNIT per tablet  Generic drug:  calcium-vitamin D  Take 1 tablet by mouth 2 (two) times daily. For osteoporosis     psyllium 58.6 % packet  Commonly known as:  METAMUCIL  Take 1 packet by mouth daily. Mix in 6-8  oz of liquid     senna 8.6 MG tablet  Commonly known as:  SENOKOT  Take 2 tablets by mouth at bedtime. For constipation     SYSTANE 0.4-0.3 % Soln  Generic drug:  Polyethyl Glycol-Propyl Glycol  Apply to eye 4 (four) times daily. Both eyes        Physical exam BP 137/64 mmHg  Pulse 70  Temp(Src) 96.4 F (35.8 C) (Oral)  Resp 18  Ht 5\' 8"  (1.727 m)  Wt 168 lb 3.2 oz (76.295 kg)  BMI 25.58 kg/m2  SpO2 95%  Wt Readings from Last 3 Encounters:  07/17/14 168 lb 3.2 oz (76.295 kg)  06/20/14 168 lb 3.2 oz (76.295 kg)  05/24/14 172 lb 9.6 oz (78.291 kg)   General- elderly female in no acute distress, pleasantly confused Head- atraumatic, normocephalic Eyes- no pallor, no icterus Neck- no lymphadenopathy Cardiovascular- normal s1,s2, systolic murmurs, diminished pedal pulses, trace edema upto ankle Respiratory- bilateral clear to auscultation, no wheeze, no rhonchi, no crackles Abdomen- bowel sounds present, soft, non tender Musculoskeletal- able to move all 4 extremities, LE weakness > upper extremities, non  ambulatory, needs assistance with transfer   Neurological- no focal deficit Psychiatry- alert and oriented to person, normal affect  Labs 02/11/14 t.chol 267, tg 185, ldl 176 03/21/14 a1c 7.7 05/21/14 na 141, k 3.8, bun 15, cr 1.04, glu 168, t.chol 138, ldl 63, hdl 48, tg 136 06/22/14 a1c 8.1  Assessment/plan  Dm type 2 with with diabetic cataract a1c 8.1 from 7.7. Legally blind in right eye from cataract. Currently diet controlled diabetes. Goal as per RP is to aim for diet management. Monitor a1c. Will need diabetic diet. Continue aspirin and statin  Age relate left eye cataract Followed by ophthalmology, not a surgical candidate, continue sysytane eye drops  Vascular dementia Without behavioral disturbance. Continue  namzaric,tolerating well. Continue assistance with ADLs, fall precautions. Fall prophylaxis.   Hypertension bp stable, continue lisinopril 2.5 mg  daily  Oneal Grout, MD  [facility] [phone] (Monday-Friday 8 am - 5 pm) [phone] (afterhours)

## 2014-07-31 LAB — BASIC METABOLIC PANEL
BUN: 15 mg/dL (ref 4–21)
Creatinine: 1 mg/dL (ref 0.5–1.1)
GLUCOSE: 158 mg/dL
Potassium: 4.1 mmol/L (ref 3.4–5.3)
SODIUM: 143 mmol/L (ref 137–147)

## 2014-07-31 LAB — HEPATIC FUNCTION PANEL
ALT: 18 U/L (ref 7–35)
AST: 20 U/L (ref 13–35)
Alkaline Phosphatase: 55 U/L (ref 25–125)
Bilirubin, Total: 0.4 mg/dL

## 2014-07-31 LAB — CBC AND DIFFERENTIAL
HCT: 36 % (ref 36–46)
HEMOGLOBIN: 12.2 g/dL (ref 12.0–16.0)
Platelets: 298 10*3/uL (ref 150–399)
WBC: 6.4 10*3/mL

## 2014-08-12 ENCOUNTER — Non-Acute Institutional Stay (SKILLED_NURSING_FACILITY): Payer: Medicare Other | Admitting: Internal Medicine

## 2014-08-12 DIAGNOSIS — E46 Unspecified protein-calorie malnutrition: Secondary | ICD-10-CM | POA: Diagnosis not present

## 2014-08-12 DIAGNOSIS — M810 Age-related osteoporosis without current pathological fracture: Secondary | ICD-10-CM | POA: Diagnosis not present

## 2014-08-12 DIAGNOSIS — K5901 Slow transit constipation: Secondary | ICD-10-CM | POA: Diagnosis not present

## 2014-08-12 NOTE — Progress Notes (Signed)
Patient ID: Suzanne Bell, female   DOB: [birth year], 81 y.o.   MRN: [id number]     [facility]  Code Status: Full Code   Chief Complaint  Patient presents with  . Medical Management of Chronic Issues   Allergies: NKDA  Code status: full code  HPI 81 y/o female patient is seen today for routine visit. She is sitting on a wheelchair. She is in no distress. Needs cueing with feeding.  No new concern from staff. She is blind in right eye. No falls reported. HPI and ROS limited with her dementia.  ROS Unable to obtain today  Past medical history reviewed    Medication List       This list is accurate as of: 08/12/14  3:33 PM.  Always use your most recent med list.               acetaminophen 325 MG tablet  Commonly known as:  TYLENOL  Take 1,000 mg by mouth at bedtime. For fever     AMBULATORY NON FORMULARY MEDICATION  Medication Name: Med Pass 90 mL twice a day for nutritional support     aspirin 81 MG tablet  Take 81 mg by mouth daily.     atorvastatin 10 MG tablet  Commonly known as:  LIPITOR  Take 10 mg by mouth daily. For High Cholesterol     cholecalciferol 1000 UNITS tablet  Commonly known as:  VITAMIN D  Take 1,000 Units by mouth daily.     docusate 50 MG/5ML liquid  Commonly known as:  COLACE  Take 100 mg by mouth daily. For constipation     lisinopril 2.5 MG tablet  Commonly known as:  PRINIVIL,ZESTRIL  Take 2.5 mg by mouth daily. For HTN     Memantine HCl-Donepezil HCl 28-10 MG Cp24  Take 1 capsule by mouth daily.     OSCAL 500/200 D-3 500-200 MG-UNIT per tablet  Generic drug:  calcium-vitamin D  Take 1 tablet by mouth 2 (two) times daily. For osteoporosis     psyllium 58.6 % packet  Commonly known as:  METAMUCIL  Take 1 packet by mouth daily. Mix in 6-8 oz of liquid     senna 8.6 MG tablet  Commonly known as:  SENOKOT  Take 2 tablets by mouth at bedtime. For constipation     SYSTANE 0.4-0.3 % Soln  Generic drug:   Polyethyl Glycol-Propyl Glycol  Apply to eye 4 (four) times daily. Both eyes        Physical exam BP 136/62 mmHg  Pulse 77  Temp(Src) 97.4 F (36.3 C)  Resp 18  Ht 5\' 8"  (1.727 m)  Wt 163 lb 6.4 oz (74.118 kg)  BMI 24.85 kg/m2  SpO2 96%  Wt Readings from Last 3 Encounters:  08/12/14 163 lb 6.4 oz (74.118 kg)  07/17/14 168 lb 3.2 oz (76.295 kg)  06/20/14 168 lb 3.2 oz (76.295 kg)   General- elderly female in no acute distress, pleasantly confused Head- atraumatic, normocephalic Eyes- no pallor, no icterus Neck- no lymphadenopathy Cardiovascular- normal s1,s2, systolic murmurs, diminished pedal pulses, trace edema upto ankle Respiratory- bilateral clear to auscultation, no wheeze, no rhonchi, no crackles Abdomen- bowel sounds present, soft, non tender Musculoskeletal- able to move all 4 extremities, LE weakness > upper extremities, non ambulatory, needs assistance with transfer   Neurological- no focal deficit Psychiatry- alert and oriented to person, normal affect  Labs 02/11/14 t.chol 267, tg 185, ldl 176 03/21/14 a1c 7.7  05/21/14 na 141, k 3.8, bun 15, cr 1.04, glu 168, t.chol 138, ldl 63, hdl 48, tg 136 06/22/14 a1c 8.1  Assessment/plan  Protein calorie malnutrition Lost few lbs from last month. Continue fortified food, puree diet, assistance with feeding in terms of cueing. Continue medpass supplement. With her dementia decline anticipated. Monitor for skin breakdown. Monitor weight.  Senile osteoporosis Continue oscal with vitamin d supplement, fall precautions  Chronic constipation Continue metamucil with colace and senokot for now, stable   Oneal Grout, MD  [facility] [phone] (Monday-Friday 8 am - 5 pm) [phone] (afterhours)

## 2014-09-09 ENCOUNTER — Non-Acute Institutional Stay (SKILLED_NURSING_FACILITY): Payer: Medicare Other | Admitting: Internal Medicine

## 2014-09-09 DIAGNOSIS — M150 Primary generalized (osteo)arthritis: Secondary | ICD-10-CM | POA: Diagnosis not present

## 2014-09-09 DIAGNOSIS — E1136 Type 2 diabetes mellitus with diabetic cataract: Secondary | ICD-10-CM

## 2014-09-09 DIAGNOSIS — IMO0002 Reserved for concepts with insufficient information to code with codable children: Secondary | ICD-10-CM

## 2014-09-09 DIAGNOSIS — E1165 Type 2 diabetes mellitus with hyperglycemia: Secondary | ICD-10-CM

## 2014-09-09 DIAGNOSIS — E785 Hyperlipidemia, unspecified: Secondary | ICD-10-CM

## 2014-09-09 NOTE — Progress Notes (Signed)
Patient ID: Suzanne Bell, female   DOB: [birth year], 81 y.o.   MRN: [id number]      [facility]  Code Status: Full Code   Chief Complaint  Patient presents with  . Medical Management of Chronic Issues   Allergies: NKDA  Code status: full code  HPI 81 y/o female patient is seen today for routine visit. She is sitting on a wheelchair. She has been at her baseline, in no distress and is participates minimally in conversation. She is in no distress. No falls reported. No new skin concern. HPI and ROS limited with her dementia. Weight remains stable  ROS Unable to obtain today  Past medical history reviewed    Medication List       This list is accurate as of: 09/09/14  2:16 PM.  Always use your most recent med list.               acetaminophen 325 MG tablet  Commonly known as:  TYLENOL  Take 1,000 mg by mouth at bedtime. For fever     AMBULATORY NON FORMULARY MEDICATION  Medication Name: Med Pass 90 mL twice a day for nutritional support     aspirin 81 MG tablet  Take 81 mg by mouth daily.     atorvastatin 10 MG tablet  Commonly known as:  LIPITOR  Take 10 mg by mouth daily. For High Cholesterol     cholecalciferol 1000 UNITS tablet  Commonly known as:  VITAMIN D  Take 1,000 Units by mouth daily.     docusate 50 MG/5ML liquid  Commonly known as:  COLACE  Take 100 mg by mouth daily. For constipation     lisinopril 2.5 MG tablet  Commonly known as:  PRINIVIL,ZESTRIL  Take 2.5 mg by mouth daily. For HTN     Memantine HCl-Donepezil HCl 28-10 MG Cp24  Take 1 capsule by mouth daily.     OSCAL 500/200 D-3 500-200 MG-UNIT per tablet  Generic drug:  calcium-vitamin D  Take 1 tablet by mouth 2 (two) times daily. For osteoporosis     psyllium 58.6 % packet  Commonly known as:  METAMUCIL  Take 1 packet by mouth daily. Mix in 6-8 oz of liquid     senna 8.6 MG tablet  Commonly known as:  SENOKOT  Take 2 tablets by mouth at bedtime. For  constipation     SYSTANE 0.4-0.3 % Soln  Generic drug:  Polyethyl Glycol-Propyl Glycol  Apply to eye 4 (four) times daily. Both eyes        Physical exam BP 110/73 mmHg  Pulse 76  Temp(Src) 96.2 F (35.7 C)  Resp 18  Ht  (1.727 m)  Wt 163 lb 6.4 oz (74.118 kg)  BMI 24.85 kg/m2  SpO2 96%  Wt Readings from Last 3 Encounters:  09/09/14 163 lb 6.4 oz (74.118 kg)  08/12/14 163 lb 6.4 oz (74.118 kg)  07/17/14 168 lb 3.2 oz (76.295 kg)   General- elderly female in no acute distress, pleasantly confused Head- atraumatic, normocephalic Eyes- no pallor, no icterus Neck- no lymphadenopathy Cardiovascular- normal s1,s2, systolic murmurs, diminished pedal pulses, trace edema upto ankle Respiratory- bilateral clear to auscultation, no wheeze, no rhonchi, no crackles Abdomen- bowel sounds present, soft, non tender Musculoskeletal- able to move all 4 extremities, LE weakness > upper extremities, non ambulatory, needs assistance with transfer   Neurological- no focal deficit Psychiatry- alert and oriented to person, normal affect  Labs 02/11/14 t.chol 267,  tg 185, ldl 176 03/21/14 a1c 7.7 05/21/14 na 141, k 3.8, bun 15, cr 1.04, glu 168, t.chol 138, ldl 63, hdl 48, tg 136 06/22/14 a1c 8.1 07/27/14 wbc 7.2, hb 9.7, plt 156 07/31/14 hb 12.2, hct 36.4, plt 298, na 143, k 4.1, bun 15, cr 0.95, glu 158, ca 8.8  Assessment/plan  Hyperlipidemia ldl 63 in 4/16. Currently on lipitor 10 mg daily, continue this and monitor lipid panel q6 months  Primary generalized OA Stable, continue tylenol 1000 mg qhs with ca-vit d supplement and monitor  Dm type 2 a1c 5/21 was 8.1. Currently on diet control. Recheck a1c next lab and if a1c>=8, start insulin. On aspirin and statin along with lisinopril  Oneal Grout, MD  [facility] [phone] (Monday-Friday 8 am - 5 pm) [phone] (afterhours)

## 2014-09-10 LAB — HEMOGLOBIN A1C: Hgb A1c MFr Bld: 8.1 % — AB (ref 4.0–6.0)

## 2014-10-14 LAB — FECAL OCCULT BLOOD, GUAIAC: Fecal Occult Blood: NEGATIVE

## 2014-10-21 ENCOUNTER — Non-Acute Institutional Stay (SKILLED_NURSING_FACILITY): Payer: Medicare Other | Admitting: Internal Medicine

## 2014-10-21 ENCOUNTER — Encounter: Payer: Self-pay | Admitting: Internal Medicine

## 2014-10-21 DIAGNOSIS — N184 Chronic kidney disease, stage 4 (severe): Secondary | ICD-10-CM

## 2014-10-21 DIAGNOSIS — I129 Hypertensive chronic kidney disease with stage 1 through stage 4 chronic kidney disease, or unspecified chronic kidney disease: Secondary | ICD-10-CM

## 2014-10-21 DIAGNOSIS — N182 Chronic kidney disease, stage 2 (mild): Secondary | ICD-10-CM | POA: Diagnosis not present

## 2014-10-21 DIAGNOSIS — N185 Chronic kidney disease, stage 5: Secondary | ICD-10-CM

## 2014-10-21 DIAGNOSIS — M159 Polyosteoarthritis, unspecified: Secondary | ICD-10-CM | POA: Insufficient documentation

## 2014-10-21 DIAGNOSIS — E1122 Type 2 diabetes mellitus with diabetic chronic kidney disease: Secondary | ICD-10-CM

## 2014-10-21 DIAGNOSIS — N183 Chronic kidney disease, stage 3 unspecified: Secondary | ICD-10-CM | POA: Insufficient documentation

## 2014-10-21 DIAGNOSIS — N189 Chronic kidney disease, unspecified: Secondary | ICD-10-CM | POA: Diagnosis not present

## 2014-10-21 DIAGNOSIS — N181 Chronic kidney disease, stage 1: Secondary | ICD-10-CM

## 2014-10-21 DIAGNOSIS — H2522 Age-related cataract, morgagnian type, left eye: Secondary | ICD-10-CM

## 2014-10-21 NOTE — Progress Notes (Signed)
Patient ID: Suzanne Bell, female   DOB: 02/02/1919, 81 y.o.   MRN: [id number]      [facility]  Code Status: Full Code   Chief Complaint  Patient presents with  . Medical Management of Chronic Issues   Allergies: NKDA  Code status: full code  HPI 81 y/o female patient is seen today for routine visit. She has been at her baseline, in no distress and is participates minimally in conversation. She is in no distress. No falls reported. No new skin concern. HPI and ROS limited with her dementia. Weight stable at 161 lbs today  ROS Unable to obtain today  Past medical history reviewed    Medication List       This list is accurate as of: 10/21/14  5:33 PM.  Always use your most recent med list.               acetaminophen 325 MG tablet  Commonly known as:  TYLENOL  Take 1,000 mg by mouth at bedtime. For fever     AMBULATORY NON FORMULARY MEDICATION  Medication Name: Med Pass 90 mL twice a day for nutritional support     aspirin 81 MG tablet  Take 81 mg by mouth daily.     atorvastatin 10 MG tablet  Commonly known as:  LIPITOR  Take 10 mg by mouth daily. For High Cholesterol     cholecalciferol 1000 UNITS tablet  Commonly known as:  VITAMIN D  Take 1,000 Units by mouth daily.     docusate 50 MG/5ML liquid  Commonly known as:  COLACE  Take 100 mg by mouth daily. For constipation     lisinopril 2.5 MG tablet  Commonly known as:  PRINIVIL,ZESTRIL  Take 2.5 mg by mouth daily. For HTN     Memantine HCl-Donepezil HCl 28-10 MG Cp24  Take 1 capsule by mouth daily.     OSCAL 500/200 D-3 500-200 MG-UNIT per tablet  Generic drug:  calcium-vitamin D  Take 1 tablet by mouth 2 (two) times daily. For osteoporosis     psyllium 58.6 % packet  Commonly known as:  METAMUCIL  Take 1 packet by mouth daily. Mix in 6-8 oz of liquid     senna 8.6 MG tablet  Commonly known as:  SENOKOT  Take 2 tablets by mouth at bedtime. For constipation     SYSTANE  0.4-0.3 % Soln  Generic drug:  Polyethyl Glycol-Propyl Glycol  Apply to eye 4 (four) times daily. Both eyes        Physical exam BP 140/62 mmHg  Pulse 77  Temp(Src) 97 F (36.1 C)  Resp 18  SpO2 97%  Wt Readings from Last 3 Encounters:  09/09/14 163 lb 6.4 oz (74.118 kg)  08/12/14 163 lb 6.4 oz (74.118 kg)  07/17/14 168 lb 3.2 oz (76.295 kg)   General- elderly female in no acute distress, pleasantly confused Head- atraumatic, normocephalic Eyes- no pallor, no icterus Neck- no lymphadenopathy Cardiovascular- normal s1,s2, systolic murmurs, diminished pedal pulses Respiratory- bilateral clear to auscultation, no wheeze, no rhonchi, no crackles Abdomen- bowel sounds present, soft, non tender Musculoskeletal- able to move all 4 extremities, LE weakness > upper extremities, non ambulatory, needs assistance with transfer, trace ankle edema   Neurological- no focal deficit Psychiatry- alert and oriented to person, normal affect  Labs 02/11/14 t.chol 267, tg 185, ldl 176 03/21/14 a1c 7.7 05/21/14 na 141, k 3.8, bun 15, cr 1.04, glu 168, t.chol 138, ldl 63,  hdl 48, tg 136 06/22/14 a1c 8.1 07/27/14 wbc 7.2, hb 9.7, plt 156 07/31/14 hb 12.2, hct 36.4, plt 298, na 143, k 4.1, bun 15, cr 0.95, glu 158, ca 8.8 09/10/14 a1c 8.1  Assessment/plan  Type 2 dm with ckd Around goal a1c of 8 for her age. Continue dietary control with baby aspirin and zestril 2.5 mg daily and lipitor 10 mg daily  Hypertensive renal disease Controlled bp reading, continue zestril 2.5 mg daily for now  ckd stage 3 Monitor renal function, with her dm, continue zestril for renal protection  Age related cataract Chronic and legally blind in left eye, poor surgical candidate with her dementia, monitor clinically with yearly eye exam  Primary generalized OA Stable, continue tylenol 1000 mg qhs with ca-vit d supplement and monitor   Oneal Grout, MD  [facility] [phone] (Monday-Friday 8 am - 5  pm) [phone] (afterhours)

## 2014-10-21 NOTE — Progress Notes (Signed)
This encounter was created in error - please disregard.

## 2014-11-18 ENCOUNTER — Encounter: Payer: Self-pay | Admitting: Internal Medicine

## 2014-11-18 ENCOUNTER — Non-Acute Institutional Stay (SKILLED_NURSING_FACILITY): Payer: Medicare Other | Admitting: Internal Medicine

## 2014-11-18 DIAGNOSIS — K5909 Other constipation: Secondary | ICD-10-CM | POA: Insufficient documentation

## 2014-11-18 DIAGNOSIS — E785 Hyperlipidemia, unspecified: Secondary | ICD-10-CM

## 2014-11-18 DIAGNOSIS — I129 Hypertensive chronic kidney disease with stage 1 through stage 4 chronic kidney disease, or unspecified chronic kidney disease: Secondary | ICD-10-CM | POA: Diagnosis not present

## 2014-11-18 DIAGNOSIS — K5900 Constipation, unspecified: Secondary | ICD-10-CM

## 2014-11-18 NOTE — Progress Notes (Signed)
Patient ID: Suzanne Bell, female   DOB: 02/15/1919, 81 y.o.   MRN: [id number]      [facility]  Code Status: Full Code   Chief Complaint  Patient presents with  . Medical Management of Chronic Issues    Routine Visit    Allergies: NKDA  Code status: full code  HPI 81 y/o female patient is seen today for routine visit. She has been at her baseline. No falls reported. No new skin concern. HPI and ROS limited with her dementia.   ROS Unable to obtain today  Past medical history reviewed    Medication List       This list is accurate as of: 11/18/14 12:49 PM.  Always use your most recent med list.               acetaminophen 500 MG tablet  Commonly known as:  TYLENOL  Take 1,000 mg by mouth at bedtime.     AMBULATORY NON FORMULARY MEDICATION  Medication Name: Med Pass 90 mL twice a day for nutritional support     aspirin 81 MG tablet  Take 81 mg by mouth daily.     atorvastatin 10 MG tablet  Commonly known as:  LIPITOR  Take 10 mg by mouth daily. For High Cholesterol     cholecalciferol 1000 UNITS tablet  Commonly known as:  VITAMIN D  Take 1,000 Units by mouth daily.     lisinopril 2.5 MG tablet  Commonly known as:  PRINIVIL,ZESTRIL  Take 2.5 mg by mouth daily. For HTN     Memantine HCl-Donepezil HCl 28-10 MG Cp24  Take 1 capsule by mouth daily.     OSCAL 500/200 D-3 500-200 MG-UNIT tablet  Generic drug:  calcium-vitamin D  Take 1 tablet by mouth 2 (two) times daily. For osteoporosis     psyllium 58.6 % packet  Commonly known as:  METAMUCIL  Take 1 packet by mouth daily. Mix in 6-8 oz of liquid     senna 8.6 MG tablet  Commonly known as:  SENOKOT  Take 2 tablets by mouth at bedtime. For constipation     SYSTANE 0.4-0.3 % Soln  Generic drug:  Polyethyl Glycol-Propyl Glycol  Apply to eye 4 (four) times daily. Both eyes        Physical exam BP 111/58 mmHg  Pulse 71  Temp(Src) 98.6 F (37 C) (Oral)  Resp 20  Ht 5\' 8"   (1.727 m)  Wt 163 lb 12.8 oz (74.299 kg)  BMI 24.91 kg/m2  SpO2 93%  Wt Readings from Last 3 Encounters:  11/18/14 163 lb 12.8 oz (74.299 kg)  09/09/14 163 lb 6.4 oz (74.118 kg)  08/12/14 163 lb 6.4 oz (74.118 kg)   General- elderly female in no acute distress, pleasantly confused Head- atraumatic, normocephalic Eyes- no pallor, no icterus Neck- no lymphadenopathy Cardiovascular- normal s1,s2, systolic murmurs, diminished pedal pulses Respiratory- bilateral clear to auscultation, no wheeze, no rhonchi, no crackles Abdomen- bowel sounds present, soft, non tender Musculoskeletal- able to move all 4 extremities, LE weakness > upper extremities, non ambulatory, needs assistance with transfer, trace ankle edema   Neurological- no focal deficit Psychiatry- alert and oriented to person, normal affect  Labs 02/11/14 t.chol 267, tg 185, ldl 176 03/21/14 a1c 7.7 05/21/14 na 141, k 3.8, bun 15, cr 1.04, glu 168, t.chol 138, ldl 63, hdl 48, tg 136 06/22/14 a1c 8.1 07/27/14 wbc 7.2, hb 9.7, plt 156 07/31/14 hb 12.2, hct 36.4, plt 298,  na 143, k 4.1, bun 15, cr 0.95, glu 158, ca 8.8 09/10/14 a1c 8.1  Assessment/plan  Hypertensive renal disease Controlled bp reading, continue zestril 2.5 mg daily for now and monitor bp  Hyperlipidemia Lipid Panel     Component Value Date/Time   CHOL 138 05/21/2014   TRIG 136 05/21/2014   HDL 48 05/21/2014   LDLCALC 63 05/21/2014  LDL at goal, continue lipitor 10 mg daily  Chronic constipation Continue senna for now and monitor   Oneal Grout, MD  [facility] Adult Medicine [phone] (Monday-Friday 8 am - 5 pm) [phone] (afterhours)

## 2015-01-20 ENCOUNTER — Encounter: Payer: Self-pay | Admitting: Internal Medicine

## 2015-01-20 ENCOUNTER — Non-Acute Institutional Stay (SKILLED_NURSING_FACILITY): Payer: Medicare Other | Admitting: Internal Medicine

## 2015-01-20 DIAGNOSIS — G309 Alzheimer's disease, unspecified: Secondary | ICD-10-CM | POA: Diagnosis not present

## 2015-01-20 DIAGNOSIS — M810 Age-related osteoporosis without current pathological fracture: Secondary | ICD-10-CM

## 2015-01-20 DIAGNOSIS — E1136 Type 2 diabetes mellitus with diabetic cataract: Secondary | ICD-10-CM | POA: Diagnosis not present

## 2015-01-20 DIAGNOSIS — E1165 Type 2 diabetes mellitus with hyperglycemia: Secondary | ICD-10-CM | POA: Diagnosis not present

## 2015-01-20 DIAGNOSIS — E785 Hyperlipidemia, unspecified: Secondary | ICD-10-CM

## 2015-01-20 DIAGNOSIS — IMO0002 Reserved for concepts with insufficient information to code with codable children: Secondary | ICD-10-CM

## 2015-01-20 DIAGNOSIS — I10 Essential (primary) hypertension: Secondary | ICD-10-CM | POA: Diagnosis not present

## 2015-01-20 DIAGNOSIS — F0280 Dementia in other diseases classified elsewhere without behavioral disturbance: Secondary | ICD-10-CM

## 2015-01-20 NOTE — Progress Notes (Signed)
This encounter was created in error - please disregard.

## 2015-01-20 NOTE — Progress Notes (Signed)
Patient ID: Suzanne Bell, female   DOB: 12/08/1919, 81 y.o.   MRN: [id number]      [facility]  Code Status: Full Code   Chief Complaint  Patient presents with  . Medical Management of Chronic Issues    Routine Visit    Allergies: NKDA  Code status: full code  HPI 81 y/o female patient is seen today for routine visit. She has been at her baseline. No falls reported. No new skin concern. HPI and ROS limited with her dementia. Can give one word answer at times.   ROS Unable to obtain today  Past medical history reviewed    Medication List       This list is accurate as of: 01/20/15  3:18 PM.  Always use your most recent med list.               acetaminophen 500 MG tablet  Commonly known as:  TYLENOL  Take 1,000 mg by mouth at bedtime.     AMBULATORY NON FORMULARY MEDICATION  Medication Name: Med Pass 90 mL twice a day for nutritional support     aspirin 81 MG tablet  Take 81 mg by mouth daily.     atorvastatin 10 MG tablet  Commonly known as:  LIPITOR  Take 10 mg by mouth daily. For High Cholesterol     cholecalciferol 1000 UNITS tablet  Commonly known as:  VITAMIN D  Take 1,000 Units by mouth daily.     lisinopril 2.5 MG tablet  Commonly known as:  PRINIVIL,ZESTRIL  Take 2.5 mg by mouth daily. For HTN     loperamide 2 MG tablet  Commonly known as:  IMODIUM A-D  Take 2 mg by mouth every 8 (eight) hours as needed for diarrhea or loose stools (For loose stool).     Memantine HCl-Donepezil HCl 28-10 MG Cp24  Take 1 capsule by mouth daily.     OSCAL 500/200 D-3 500-200 MG-UNIT tablet  Generic drug:  calcium-vitamin D  Take 1 tablet by mouth 2 (two) times daily. For osteoporosis     psyllium 58.6 % packet  Commonly known as:  METAMUCIL  Take 1 packet by mouth daily. Mix in 6-8 oz of liquid     senna 8.6 MG tablet  Commonly known as:  SENOKOT  Take 2 tablets by mouth at bedtime. For constipation     SYSTANE 0.4-0.3 % Soln  Generic  drug:  Polyethyl Glycol-Propyl Glycol  Apply to eye 4 (four) times daily. Both eyes        Physical exam BP 147/72 mmHg  Pulse 75  Temp(Src) 98 F (36.7 C) (Oral)  Resp 16  SpO2 96%  Wt Readings from Last 3 Encounters:  11/18/14 163 lb 12.8 oz (74.299 kg)  09/09/14 163 lb 6.4 oz (74.118 kg)  08/12/14 163 lb 6.4 oz (74.118 kg)   General- elderly female in no acute distress, pleasantly confused Head- atraumatic, normocephalic Eyes- no pallor, no icterus Neck- no lymphadenopathy Cardiovascular- normal s1,s2, systolic murmurs, diminished pedal pulses Respiratory- bilateral clear to auscultation, no wheeze, no rhonchi, no crackles Abdomen- bowel sounds present, soft, non tender Musculoskeletal- able to move all 4 extremities, LE weakness > upper extremities, non ambulatory, needs assistance with transfer, trace ankle edema   Neurological- no focal deficit Psychiatry- alert and oriented to person, normal affect  Labs 02/11/14 t.chol 267, tg 185, ldl 176 03/21/14 a1c 7.7 05/21/14 na 141, k 3.8, bun 15, cr 1.04, glu 168,  t.chol 138, ldl 63, hdl 48, tg 136 06/22/14 a1c 8.1 07/27/14 wbc 7.2, hb 9.7, plt 156 07/31/14 hb 12.2, hct 36.4, plt 298, na 143, k 4.1, bun 15, cr 0.95, glu 158, ca 8.8 09/10/14 a1c 8.1  Assessment/plan  Senile osteoporosis Continue oscal with vitamin d. Fall precautions. Check vitamin d level  Hypertension Controlled bp reading, continue zestril 2.5 mg daily for now and monitor bp. Check bmp. Continue baby aspirin but given her age, change this to [facility] to reduce risk for gastritis  Dementia Continue namzeric for now and monitor clinically, decline anticipated. Monitor weight. To provide assistance with her ADLS. Fall precautions. Pressure ulcer prophylaxis.   Hyperlipidemia Lipid Panel     Component Value Date/Time   CHOL 138 05/21/2014   TRIG 136 05/21/2014   HDL 48 05/21/2014   LDLCALC 63 05/21/2014  check lipid panel. If LDL at goal <100, discontinue  statin  DM type 2 with diabetic cataract a1c 8.1. Not on any diabetes medication per discussion with RP. On statin and aspirin with ACEI. Continue systane eye drops. Fall prevention   Oneal Grout, MD  [facility] Adult Medicine [phone] (Monday-Friday 8 am - 5 pm) [phone] (afterhours)

## 2015-01-21 LAB — CBC AND DIFFERENTIAL
HEMATOCRIT: 37 % (ref 36–46)
Hemoglobin: 11.6 g/dL — AB (ref 12.0–16.0)
Platelets: 295 10*3/uL (ref 150–399)
WBC: 5.5 10^3/mL

## 2015-01-21 LAB — HEPATIC FUNCTION PANEL
ALK PHOS: 47 U/L (ref 25–125)
ALT: 13 U/L (ref 7–35)
AST: 16 U/L (ref 13–35)
BILIRUBIN, TOTAL: 0.4 mg/dL

## 2015-01-21 LAB — BASIC METABOLIC PANEL
BUN: 20 mg/dL (ref 4–21)
CREATININE: 1 mg/dL (ref 0.5–1.1)
GLUCOSE: 142 mg/dL
Potassium: 4 mmol/L (ref 3.4–5.3)
SODIUM: 143 mmol/L (ref 137–147)

## 2015-01-21 LAB — HEMOGLOBIN A1C: HEMOGLOBIN A1C: 7.6

## 2015-01-21 LAB — LIPID PANEL
Cholesterol: 189 mg/dL (ref 0–200)
HDL: 39 mg/dL (ref 35–70)
LDL CALC: 121 mg/dL
Triglycerides: 147 mg/dL (ref 40–160)

## 2015-01-24 ENCOUNTER — Other Ambulatory Visit: Payer: Self-pay | Admitting: *Deleted

## 2015-01-24 MED ORDER — ALPRAZOLAM 0.25 MG PO TABS: ORAL_TABLET | ORAL | Status: DC

## 2015-01-24 NOTE — Telephone Encounter (Signed)
[facility] 

## 2015-03-03 ENCOUNTER — Non-Acute Institutional Stay (SKILLED_NURSING_FACILITY): Payer: Medicare Other | Admitting: Internal Medicine

## 2015-03-03 ENCOUNTER — Encounter: Payer: Self-pay | Admitting: Internal Medicine

## 2015-03-03 DIAGNOSIS — G309 Alzheimer's disease, unspecified: Secondary | ICD-10-CM

## 2015-03-03 DIAGNOSIS — F0280 Dementia in other diseases classified elsewhere without behavioral disturbance: Secondary | ICD-10-CM

## 2015-03-03 DIAGNOSIS — K5909 Other constipation: Secondary | ICD-10-CM

## 2015-03-03 DIAGNOSIS — K5900 Constipation, unspecified: Secondary | ICD-10-CM

## 2015-03-03 DIAGNOSIS — I10 Essential (primary) hypertension: Secondary | ICD-10-CM | POA: Diagnosis not present

## 2015-03-03 NOTE — Progress Notes (Signed)
Patient ID: Suzanne Bell, female   DOB: 07-12-1919, 82 y.o.   MRN: [id number]      [facility]  Code Status: Full Code   Chief Complaint  Patient presents with  . Medical Management of Chronic Issues    Routine Visit   Allergies: NKDA  Code status: full code  HPI 82 y/o female patient is seen today for routine visit. She does not participate in HPI and ROS. She has been at her baseline per nursing staff. No falls reported. No new skin concern.   ROS Unable to obtain today  Past medical history reviewed    Medication List       This list is accurate as of: 03/03/15  3:02 PM.  Always use your most recent med list.               acetaminophen 500 MG tablet  Commonly known as:  TYLENOL  Take 1,000 mg by mouth at bedtime.     ALPRAZolam 0.25 MG tablet  Commonly known as:  XANAX  Take one tablet by mouth twice daily as needed for anxiety     AMBULATORY NON FORMULARY MEDICATION  Medication Name: Med Pass 90 mL three a day for nutritional support     aspirin EC 81 MG tablet  Take 81 mg by mouth daily.     atorvastatin 10 MG tablet  Commonly known as:  LIPITOR  Take 10 mg by mouth daily. For High Cholesterol     cholecalciferol 1000 units tablet  Commonly known as:  VITAMIN D  Take 1,000 Units by mouth daily.     escitalopram 10 MG tablet  Commonly known as:  LEXAPRO  Take 10 mg by mouth daily.     lidocaine-prilocaine cream  Commonly known as:  EMLA  Apply 1 application topically 4 (four) times daily as needed.     lisinopril 2.5 MG tablet  Commonly known as:  PRINIVIL,ZESTRIL  Take 2.5 mg by mouth daily. For HTN     loperamide 2 MG tablet  Commonly known as:  IMODIUM A-D  Take 2 mg by mouth every 8 (eight) hours as needed for diarrhea or loose stools (For loose stool).     Memantine HCl-Donepezil HCl 28-10 MG Cp24  Take 1 capsule by mouth daily.     OSCAL 500/200 D-3 500-200 MG-UNIT tablet  Generic drug:  calcium-vitamin D  Take  1 tablet by mouth 2 (two) times daily. For osteoporosis     psyllium 58.6 % packet  Commonly known as:  METAMUCIL  Take 1 packet by mouth daily. Mix in 6-8 oz of liquid     senna 8.6 MG tablet  Commonly known as:  SENOKOT  Take 2 tablets by mouth at bedtime. For constipation     SYSTANE 0.4-0.3 % Soln  Generic drug:  Polyethyl Glycol-Propyl Glycol  Apply to eye 4 (four) times daily. Both eyes        Physical exam BP 164/67 mmHg  Pulse 69  Temp(Src) 97.3 F (36.3 C) (Oral)  Resp 18  Ht  (1.727 m)  Wt 163 lb 12.8 oz (74.299 kg)  BMI 24.91 kg/m2  SpO2 96%  Wt Readings from Last 3 Encounters:  03/03/15 163 lb 12.8 oz (74.299 kg)  11/18/14 163 lb 12.8 oz (74.299 kg)  09/09/14 163 lb 6.4 oz (74.118 kg)   General- elderly female in no acute distress, pleasantly confused Head- atraumatic, normocephalic Eyes- no pallor, no icterus Neck- no  lymphadenopathy Cardiovascular- normal s1,s2, systolic murmurs, diminished pedal pulses Respiratory- bilateral clear to auscultation, no wheeze, no rhonchi, no crackles Abdomen- bowel sounds present, soft, non tender Musculoskeletal- able to move all 4 extremities, LE weakness > upper extremities, non ambulatory, needs assistance with transfer, trace ankle edema   Neurological- no focal deficit Psychiatry- alert and oriented to person, normal affect  Labs  CBC Latest Ref Rng 07/31/2014 02/11/2014  WBC - 6.4 6.0  Hemoglobin 12.0 - 16.0 g/dL 16.1 09.6  Hematocrit 36 - 46 % 36 40  Platelets 150 - 399 K/L 298 285   CMP Latest Ref Rng 07/31/2014 05/21/2014  BUN 4 - 21 mg/dL 15 15  Creatinine 0.5 - 1.1 mg/dL 1.0 1.0  Sodium 045 - 409 mmol/L 143 141  Potassium 3.4 - 5.3 mmol/L 4.1 3.8  Alkaline Phos 25 - 125 U/L 55 46  AST 13 - 35 U/L 20 19  ALT 7 - 35 U/L 18 15   Lab Results  Component Value Date   HGBA1C 8.1* 09/10/2014   Lipid Panel     Component Value Date/Time   CHOL 138 05/21/2014   TRIG 136 05/21/2014   HDL 48  05/21/2014   LDLCALC 63 05/21/2014     Assessment/plan  Hypertension Elevated SBP but given her age, will avoid tight control. continue zestril 2.5 mg daily for now and monitor bp. Check bmp. Continue baby aspirin but given her age, change this to [facility] to reduce risk for gastritis  Dementia Continue namzeric for now and monitor clinically, decline anticipated. Monitor weight. To provide assistance with her ADLS. Fall precautions. Pressure ulcer prophylaxis.   Constipation Continue senokot and metamucil   Oneal Grout, MD  [facility] Adult Medicine [phone] (Monday-Friday 8 am - 5 pm) [phone] (afterhours)

## 2015-03-03 NOTE — Progress Notes (Signed)
Patient ID: Suzanne Bell, female   DOB: 1919/03/11, 82 y.o.   MRN: [id number] Open in error

## 2015-03-31 ENCOUNTER — Non-Acute Institutional Stay (SKILLED_NURSING_FACILITY): Payer: Medicare Other | Admitting: Internal Medicine

## 2015-03-31 ENCOUNTER — Encounter: Payer: Self-pay | Admitting: Internal Medicine

## 2015-03-31 DIAGNOSIS — F329 Major depressive disorder, single episode, unspecified: Secondary | ICD-10-CM | POA: Diagnosis not present

## 2015-03-31 DIAGNOSIS — M150 Primary generalized (osteo)arthritis: Secondary | ICD-10-CM

## 2015-03-31 DIAGNOSIS — M810 Age-related osteoporosis without current pathological fracture: Secondary | ICD-10-CM

## 2015-03-31 DIAGNOSIS — E785 Hyperlipidemia, unspecified: Secondary | ICD-10-CM | POA: Diagnosis not present

## 2015-03-31 NOTE — Progress Notes (Signed)
Patient ID: Suzanne Bell, female   DOB: 04-18-1919, 82 y.o.   MRN: [id number]   Location:  [facility] Room Number: 1105-2 Place of Service:  SNF (31)   Oneal Grout, MD  Patient Care Team: Oneal Grout, MD as PCP - General (Internal Medicine)  Extended Emergency Contact Information Primary Emergency Contact: [name],[name] Address: [address]          [name]  [address] Home Phone: [phone] Relation: None  Code Status:  Full code  Goals of care: Advanced Directive information No flowsheet data found.   Chief Complaint  Patient presents with  . Medical Management of Chronic Issues    Routine Visit    No Known Allergies  HPI:  Pt is a 82 y.o. female seen today for medical management of chronic diseases.  She does not participate in HPI and ROS. She has been at her baseline per nursing staff. No falls reported. No new skin concern.   ROS Unable to obtain today      Past Medical History  Diagnosis Date  . COPD (chronic obstructive pulmonary disease) (HCC)   . Hypertension   . Hyperlipidemia   . Diabetes mellitus without complication (HCC)     Type 2  . Osteoporosis   . Cataract   . Arthritis     osteo  . Depression   . Dementia    History reviewed. No pertinent past surgical history.     Medication List       This list is accurate as of: 03/31/15  3:41 PM.  Always use your most recent med list.               acetaminophen 500 MG tablet  Commonly known as:  TYLENOL  Take 1,000 mg by mouth at bedtime.     ALPRAZolam 0.25 MG tablet  Commonly known as:  XANAX  Take one tablet by mouth twice daily as needed for anxiety     AMBULATORY NON FORMULARY MEDICATION  Medication Name: Med Pass 90 mL three a day for nutritional support     aspirin EC 81 MG tablet  Take 81 mg by mouth daily.     atorvastatin 10 MG tablet  Commonly known as:  LIPITOR  Take 10 mg by mouth daily. For High Cholesterol     cholecalciferol 1000 units tablet  Commonly known as:  VITAMIN D  Take 1,000 Units by mouth daily.     escitalopram 10 MG tablet  Commonly known as:  LEXAPRO  Take 10 mg by mouth daily.     lidocaine-prilocaine cream  Commonly known as:  EMLA  Apply 1 application topically 4 (four) times daily as needed.     lisinopril 2.5 MG tablet  Commonly known as:  PRINIVIL,ZESTRIL  Take 2.5 mg by mouth daily. For HTN     loperamide 2 MG tablet  Commonly known as:  IMODIUM A-D  Take 2 mg by mouth every 8 (eight) hours as needed for diarrhea or loose stools (For loose stool).     Memantine HCl-Donepezil HCl 28-10 MG Cp24  Take 1 capsule by mouth daily.     OSCAL 500/200 D-3 500-200 MG-UNIT tablet  Generic drug:  calcium-vitamin D  Take 1 tablet by mouth 2 (two) times daily. For osteoporosis     psyllium 58.6 % packet  Commonly known as:  METAMUCIL  Take 1 packet by mouth daily. Mix in 6-8 oz of liquid  senna 8.6 MG tablet  Commonly known as:  SENOKOT  Take 2 tablets by mouth at bedtime. For constipation     SYSTANE 0.4-0.3 % Soln  Generic drug:  Polyethyl Glycol-Propyl Glycol  Apply to eye 4 (four) times daily. Both eyes         Immunization History  Administered Date(s) Administered  . Influenza-Unspecified 11/04/2013, 11/09/2014  . PPD Test 12/11/2013  . Pneumococcal-Unspecified 02/29/2012, 11/30/2013   Pertinent  Health Maintenance Due  Topic Date Due  . HEMOGLOBIN A1C  02/02/2016 (Originally 03/13/2015)  . PNA vac Low Risk Adult (2 of 2 - PCV13) 02/02/2016 (Originally 12/01/2014)  . DEXA SCAN  02/02/2023 (Originally 07/26/1986)  . INFLUENZA VACCINE  09/02/2015  . FOOT EXAM  09/29/2015  . OPHTHALMOLOGY EXAM  11/19/2015   No flowsheet data found. Functional Status Survey:     Physical exam: Filed Vitals:   03/31/15 1442  BP: 131/73  Pulse: 78  Temp: 98.4 F (36.9 C)  TempSrc: Oral  Resp: 18  Height:  (1.727 m)  Weight: 154 lb 6.4 oz (70.035 kg)    SpO2: 96%   Body mass index is 23.48 kg/(m^2).  General- elderly female in no acute distress, pleasantly confused Head- atraumatic, normocephalic Eyes- no pallor, no icterus Neck- no lymphadenopathy Cardiovascular- normal s1,s2, systolic murmurs, diminished pedal pulses Respiratory- bilateral clear to auscultation, no wheeze, no rhonchi, no crackles Abdomen- bowel sounds present, soft, non tender Musculoskeletal- able to move all 4 extremities, LE weakness > upper extremities, non ambulatory, needs assistance with transfer, trace ankle edema   Neurological- pleasantly confused    Labs reviewed:  Recent Labs  05/21/14 07/31/14 01/21/15  NA 141 143 143  K 3.8 4.1 4.0  BUN CREATININE 1.0 1.0 1.0    Recent Labs  05/21/14 07/31/14 01/21/15  AST ALT ALKPHOS 46 55 47    Recent Labs  07/31/14 01/21/15  WBC 6.4 5.5  HGB 12.2 11.6*  HCT 36 37  PLT 298 295   No results found for: TSH Lab Results  Component Value Date   HGBA1C 7.6 01/21/2015   Lab Results  Component Value Date   CHOL 189 01/21/2015   HDL 39 01/21/2015   LDLCALC 121 01/21/2015   TRIG 147 01/21/2015     Assessment/Plan  Senile osteoporosis Continue oscal with vitamin d. Fall precautions. Check vitamin d level.   OA Continue tylenol 1000 mg at bedtime and monitor  Hyperlipidemia Lipid Panel     Component Value Date/Time   CHOL 189 01/21/2015   TRIG 147 01/21/2015   HDL 39 01/21/2015   LDLCALC 121 01/21/2015  LDL elevated. Continue lipitor 10 mg daily for now  Chronic depression Stable mood, attempt GDR with dose reduction to 5 mg daily for escitalopram and monitor   Family/ staff Communication: reviewed care plan with patient and charge nurse   Oneal Grout, MD Internal Medicine [facility] [address] [place], [place] [address] Cell Phone (Monday-Friday 8 am - 5 pm): [phone] On Call: [phone] and follow  prompts after 5 pm and on weekends Office Phone: [phone] Office Fax: [phone]

## 2015-03-31 NOTE — Progress Notes (Signed)
This encounter was created in error - please disregard.

## 2015-04-02 LAB — HEPATIC FUNCTION PANEL
ALK PHOS: 48 U/L (ref 25–125)
ALT: 10 U/L (ref 7–35)
AST: 13 U/L (ref 13–35)
Bilirubin, Total: 0.4 mg/dL

## 2015-04-02 LAB — CBC AND DIFFERENTIAL
HCT: 38 % (ref 36–46)
Hemoglobin: 11.9 g/dL — AB (ref 12.0–16.0)
Platelets: 304 10*3/uL (ref 150–399)
WBC: 5.7 10*3/mL

## 2015-04-02 LAB — BASIC METABOLIC PANEL
BUN: 18 mg/dL (ref 4–21)
CREATININE: 1 mg/dL (ref 0.5–1.1)
Glucose: 150 mg/dL
POTASSIUM: 4 mmol/L (ref 3.4–5.3)
Sodium: 142 mmol/L (ref 137–147)

## 2015-04-02 LAB — LIPID PANEL
Cholesterol: 160 mg/dL (ref 0–200)
HDL: 46 mg/dL (ref 35–70)
LDL Cholesterol: 75 mg/dL
Triglycerides: 198 mg/dL — AB (ref 40–160)

## 2015-04-02 LAB — HEMOGLOBIN A1C: Hemoglobin A1C: 7.2

## 2015-04-09 LAB — MICROALBUMIN, URINE: MICROALB UR: 20.8

## 2015-04-30 ENCOUNTER — Encounter: Payer: Self-pay | Admitting: Internal Medicine

## 2015-04-30 ENCOUNTER — Non-Acute Institutional Stay (SKILLED_NURSING_FACILITY): Payer: Medicare Other | Admitting: Internal Medicine

## 2015-04-30 DIAGNOSIS — K5909 Other constipation: Secondary | ICD-10-CM

## 2015-04-30 DIAGNOSIS — F0280 Dementia in other diseases classified elsewhere without behavioral disturbance: Secondary | ICD-10-CM | POA: Diagnosis not present

## 2015-04-30 DIAGNOSIS — I10 Essential (primary) hypertension: Secondary | ICD-10-CM | POA: Diagnosis not present

## 2015-04-30 DIAGNOSIS — G301 Alzheimer's disease with late onset: Secondary | ICD-10-CM

## 2015-04-30 DIAGNOSIS — K5900 Constipation, unspecified: Secondary | ICD-10-CM | POA: Diagnosis not present

## 2015-04-30 DIAGNOSIS — L8990 Pressure ulcer of unspecified site, unspecified stage: Secondary | ICD-10-CM

## 2015-04-30 NOTE — Progress Notes (Signed)
Patient ID: Suzanne Bell, female   DOB: May 12, 1919, 82 y.o.   MRN: [id number]   Location:  [facility] Room Number: 1105-2 Place of Service:  SNF (31)   Oneal Grout, MD  Patient Care Team: Oneal Grout, MD as PCP - General (Internal Medicine)  Extended Emergency Contact Information Primary Emergency Contact: [name],[name] Address: [address]          [name]  [address] Home Phone: [phone] Relation: None  Code Status:  Full code  Goals of care: Advanced Directive information No flowsheet data found.   Chief Complaint  Patient presents with  . Medical Management of Chronic Issues    Routine Visit    No Known Allergies  HPI:  Pt is a 82 y.o. female seen today for medical management of chronic diseases.  She does not participate in HPI and ROS. She has been at her baseline per nursing staff. No falls reported. Staff have noticed a new sore to her bottom. She is tolerating reduced dosing of lexapro well.   ROS Unable to obtain today      Past Medical History  Diagnosis Date  . COPD (chronic obstructive pulmonary disease) (HCC)   . Hypertension   . Hyperlipidemia   . Diabetes mellitus without complication (HCC)     Type 2  . Osteoporosis   . Cataract   . Arthritis     osteo  . Depression   . Dementia    No past surgical history on file.     Medication List       This list is accurate as of: 04/30/15  4:10 PM.  Always use your most recent med list.               acetaminophen 500 MG tablet  Commonly known as:  TYLENOL  Take 1,000 mg by mouth at bedtime.     ALPRAZolam 0.25 MG tablet  Commonly known as:  XANAX  Take one tablet by mouth twice daily as needed for anxiety     AMBULATORY NON FORMULARY MEDICATION  Medication Name: Med Pass 90 mL three a day for nutritional support     aspirin EC 81 MG tablet  Take 81 mg by mouth daily.     atorvastatin 10 MG tablet  Commonly known as:  LIPITOR    Take 10 mg by mouth at bedtime. For High Cholesterol     cholecalciferol 1000 units tablet  Commonly known as:  VITAMIN D  Take 1,000 Units by mouth daily.     escitalopram 5 MG tablet  Commonly known as:  LEXAPRO  Take 5 mg by mouth daily.     lidocaine-prilocaine cream  Commonly known as:  EMLA  Apply 1 application topically 4 (four) times daily as needed.     lisinopril 2.5 MG tablet  Commonly known as:  PRINIVIL,ZESTRIL  Take 2.5 mg by mouth daily. For HTN     loperamide 2 MG tablet  Commonly known as:  IMODIUM A-D  Take 2 mg by mouth every 8 (eight) hours as needed for diarrhea or loose stools (For loose stool).     Memantine HCl-Donepezil HCl 28-10 MG Cp24  Take 1 capsule by mouth daily.     OSCAL 500/200 D-3 500-200 MG-UNIT tablet  Generic drug:  calcium-vitamin D  Take 1 tablet by mouth 2 (two) times daily. For osteoporosis     psyllium 58.6 % packet  Commonly known as:  METAMUCIL  Take  1 packet by mouth daily. Mix in 6-8 oz of liquid     senna 8.6 MG tablet  Commonly known as:  SENOKOT  Take 2 tablets by mouth at bedtime. For constipation     SYSTANE 0.4-0.3 % Soln  Generic drug:  Polyethyl Glycol-Propyl Glycol  Apply to eye 4 (four) times daily. Both eyes         Immunization History  Administered Date(s) Administered  . Influenza-Unspecified 11/04/2013, 11/09/2014  . PPD Test 12/11/2013  . Pneumococcal-Unspecified 02/29/2012, 11/30/2013   Pertinent  Health Maintenance Due  Topic Date Due  . HEMOGLOBIN A1C  02/02/2016 (Originally 07/22/2015)  . PNA vac Low Risk Adult (2 of 2 - PCV13) 02/02/2016 (Originally 12/01/2014)  . DEXA SCAN  02/02/2023 (Originally 07/26/1986)  . INFLUENZA VACCINE  09/02/2015  . FOOT EXAM  09/29/2015  . OPHTHALMOLOGY EXAM  11/19/2015   No flowsheet data found. Functional Status Survey:     Physical exam: Filed Vitals:   04/30/15 1600  BP: 112/63  Pulse: 74  Temp: 97.1 F (36.2 C)  TempSrc: Oral  Resp: 18   Height: 5\' 8"  (1.727 m)  Weight: 158 lb 6.4 oz (71.85 kg)  SpO2: 98%   Body mass index is 24.09 kg/(m^2).  General- elderly female in no acute distress, frail, pleasantly confused Head- atraumatic, normocephalic Eyes- no pallor, no icterus Neck- no lymphadenopathy Cardiovascular- normal s1,s2, systolic murmurs, diminished pedal pulses Respiratory- bilateral clear to auscultation, no wheeze, no rhonchi, no crackles Abdomen- bowel sounds present, soft, non tender Musculoskeletal- able to move all 4 extremities, LE weakness > upper extremities, non ambulatory, needs assistance with transfer, trace ankle edema   Neurological- pleasantly confused    Labs reviewed:  Recent Labs  07/31/14 01/21/15 04/02/15  NA 143 143 142  K 4.1 4.0 4.0  BUN 15 20 18   CREATININE 1.0 1.0 1.0    Recent Labs  07/31/14 01/21/15 04/02/15  AST 20 16 13   ALT 18 13 10   ALKPHOS 55 47 48    Recent Labs  07/31/14 01/21/15 04/02/15  WBC 6.4 5.5 5.7  HGB 12.2 11.6* 11.9*  HCT 36 37 38  PLT 298 295 304   No results found for: TSH Lab Results  Component Value Date   HGBA1C 7.2 04/02/2015   Lab Results  Component Value Date   CHOL 160 04/02/2015   HDL 46 04/02/2015   LDLCALC 75 04/02/2015   TRIG 198* 04/02/2015     Assessment/Plan  Pressure ulcer Will have wound nurse address this, add decubivite to promote wound healing. Pressure ulcer prophylaxis  Hypertension Controlled bp reading, continue zestril 2.5 mg daily for now and monitor bp. Reviewed bmp  Dementia Continue namzeric for now and monitor clinically, decline anticipated. Monitor weight. To provide assistance with her ADLS. Fall precautions. Pressure ulcer prophylaxis.   Hyperlipidemia Lipid Panel     Component Value Date/Time   CHOL 160 04/02/2015   TRIG 198* 04/02/2015   HDL 46 04/02/2015   LDLCALC 75 04/02/2015  TG elevated. Continue lipitor 10 mg daily for now  Chronic constipation Satbel, continue metamucil and  senokot.    Oneal GroutMAHIMA Breanna Mcdaniel, MD Internal Medicine [facility] [address] [place], [place] [address] Cell Phone (Monday-Friday 8 am - 5 pm): [phone] On Call: [phone] and follow prompts after 5 pm and on weekends Office Phone: [phone] Office Fax: [phone]

## 2015-04-30 NOTE — Progress Notes (Signed)
This encounter was created in error - please disregard.

## 2015-05-23 ENCOUNTER — Non-Acute Institutional Stay (SKILLED_NURSING_FACILITY): Payer: Medicare Other | Admitting: Internal Medicine

## 2015-05-23 ENCOUNTER — Encounter: Payer: Self-pay | Admitting: Internal Medicine

## 2015-05-23 DIAGNOSIS — F0280 Dementia in other diseases classified elsewhere without behavioral disturbance: Secondary | ICD-10-CM

## 2015-05-23 DIAGNOSIS — M810 Age-related osteoporosis without current pathological fracture: Secondary | ICD-10-CM | POA: Diagnosis not present

## 2015-05-23 DIAGNOSIS — G301 Alzheimer's disease with late onset: Secondary | ICD-10-CM

## 2015-05-23 DIAGNOSIS — F329 Major depressive disorder, single episode, unspecified: Secondary | ICD-10-CM | POA: Diagnosis not present

## 2015-05-23 NOTE — Progress Notes (Signed)
Patient ID: Suzanne Bell, female   DOB: July 07, 1919, 82 y.o.   MRN: [id number]   Location:  [facility] Room Number: 1105-2 Place of Service:  SNF (31)   Suzanne Bell, [facility], MD  Patient Care Team: Oneal GroutMahima Nayanna Seaborn, MD as PCP - General (Internal Medicine)  Extended Emergency Contact Information Primary Emergency Contact: [name],[name] Address: [address]          [name],  [id number] Home Phone: [phone] Relation: None  Code Status:  Full code    Chief Complaint  Patient presents with  . Medical Management of Chronic Issues    Routine Visit    No Known Allergies  HPI:  Pt is a 82 y.o. female seen today for medical management of chronic diseases.  She does not participate in HPI and ROS. She has been at her baseline per nursing staff. No falls reported.    ROS Unable to obtain today      Past Medical History  Diagnosis Date  . COPD (chronic obstructive pulmonary disease) (HCC)   . Hypertension   . Hyperlipidemia   . Diabetes mellitus without complication (HCC)     Type 2  . Osteoporosis   . Cataract   . Arthritis     osteo  . Depression   . Dementia    No past surgical history on file.     Medication List       This list is accurate as of: 05/23/15 12:00 PM.  Always use your most recent med list.               acetaminophen 500 MG tablet  Commonly known as:  TYLENOL  Take 1,000 mg by mouth at bedtime.     ALPRAZolam 0.25 MG tablet  Commonly known as:  XANAX  Take one tablet by mouth twice daily as needed for anxiety     AMBULATORY NON FORMULARY MEDICATION  Medication Name: Med Pass 90 mL three a day for nutritional support     aspirin EC 81 MG tablet  Take 81 mg by mouth daily.     atorvastatin 10 MG tablet  Commonly known as:  LIPITOR  Take 10 mg by mouth at bedtime. For High Cholesterol     cholecalciferol 1000 units tablet  Commonly known as:  VITAMIN D  Take 1,000 Units by mouth daily.     escitalopram 5 MG tablet  Commonly known as:  LEXAPRO  Take 5 mg by mouth daily.     lidocaine-prilocaine cream  Commonly known as:  EMLA  Apply 1 application topically 4 (four) times daily as needed.     lisinopril 2.5 MG tablet  Commonly known as:  PRINIVIL,ZESTRIL  Take 2.5 mg by mouth daily. For HTN     loperamide 2 MG tablet  Commonly known as:  IMODIUM A-D  Take 2 mg by mouth every 8 (eight) hours as needed for diarrhea or loose stools (For loose stool).     Memantine HCl-Donepezil HCl 28-10 MG Cp24  Take 1 capsule by mouth daily.     multivitamin capsule  Take 1 capsule by mouth daily.     OSCAL 500/200 D-3 500-200 MG-UNIT tablet  Generic drug:  calcium-vitamin D  Take 1 tablet by mouth 2 (two) times daily. For osteoporosis     psyllium 58.6 % packet  Commonly known as:  METAMUCIL  Take 1 packet by mouth daily. Mix in 6-8 oz of liquid     senna  8.6 MG tablet  Commonly known as:  SENOKOT  Take 2 tablets by mouth at bedtime. For constipation     SYSTANE 0.4-0.3 % Soln  Generic drug:  Polyethyl Glycol-Propyl Glycol  Apply to eye 4 (four) times daily. Both eyes         Immunization History  Administered Date(s) Administered  . Influenza-Unspecified 11/04/2013, 11/09/2014  . PPD Test 12/11/2013  . Pneumococcal-Unspecified 02/29/2012, 11/30/2013   Pertinent  Health Maintenance Due  Topic Date Due  . PNA vac Low Risk Adult (2 of 2 - PCV13) 02/02/2016 (Originally 12/01/2014)  . DEXA SCAN  02/02/2023 (Originally 07/26/1986)  . INFLUENZA VACCINE  09/02/2015  . FOOT EXAM  09/29/2015  . HEMOGLOBIN A1C  10/03/2015  . OPHTHALMOLOGY EXAM  11/19/2015    Physical exam: Filed Vitals:   05/23/15 1108  BP: 147/65  Pulse: 67  Temp: 97.2 F (36.2 C)  TempSrc: Oral  Resp: 18  Height:  (1.727 m)  Weight: 158 lb 6.4 oz (71.85 kg)  SpO2: 97%   Body mass index is 24.09 kg/(m^2).  General- elderly female in no acute distress, frail, pleasantly confused Head-  atraumatic, normocephalic Eyes- no pallor, no icterus Neck- no lymphadenopathy Cardiovascular- normal s1,s2, systolic murmurs, diminished pedal pulses Respiratory- bilateral clear to auscultation, no wheeze, no rhonchi, no crackles Abdomen- bowel sounds present, soft, non tender Musculoskeletal- able to move all 4 extremities, LE weakness > upper extremities, non ambulatory, needs assistance with transfer, trace ankle edema   Neurological- pleasantly confused    Labs reviewed:  Recent Labs  07/31/14 01/21/15 04/02/15  NA 143 143 142  K 4.1 4.0 4.0  BUN CREATININE 1.0 1.0 1.0    Recent Labs  07/31/14 01/21/15 04/02/15  AST ALT ALKPHOS 55 47 48    Recent Labs  07/31/14 01/21/15 04/02/15  WBC 6.4 5.5 5.7  HGB 12.2 11.6* 11.9*  HCT 36 37 38  PLT 298 295 304   No results found for: TSH Lab Results  Component Value Date   HGBA1C 7.2 04/02/2015   Lab Results  Component Value Date   CHOL 160 04/02/2015   HDL 46 04/02/2015   LDLCALC 75 04/02/2015   TRIG 198* 04/02/2015     Assessment/Plan  Chronic depression Stable lexapro 5 mg daily  Dementia Continue namzeric for now and monitor clinically, decline anticipated. Monitor weight. To provide assistance with her ADLS. Fall precautions. Pressure ulcer prophylaxis.   Senile osteoporosis Continue oscal with vitamin d. Fall precautions. Check vitamin d level    Oneal Grout, MD Internal Medicine [facility] [address] [place], [place] [address] Cell Phone (Monday-Friday 8 am - 5 pm): [phone] On Call: [phone] and follow prompts after 5 pm and on weekends Office Phone: [phone] Office Fax: [phone]

## 2015-05-23 NOTE — Progress Notes (Signed)
This encounter was created in error - please disregard.

## 2015-06-27 ENCOUNTER — Non-Acute Institutional Stay (SKILLED_NURSING_FACILITY): Payer: Medicare Other | Admitting: Internal Medicine

## 2015-06-27 ENCOUNTER — Encounter: Payer: Self-pay | Admitting: Internal Medicine

## 2015-06-27 DIAGNOSIS — F0280 Dementia in other diseases classified elsewhere without behavioral disturbance: Secondary | ICD-10-CM

## 2015-06-27 DIAGNOSIS — G301 Alzheimer's disease with late onset: Secondary | ICD-10-CM | POA: Diagnosis not present

## 2015-06-27 DIAGNOSIS — I10 Essential (primary) hypertension: Secondary | ICD-10-CM

## 2015-06-27 DIAGNOSIS — K5900 Constipation, unspecified: Secondary | ICD-10-CM | POA: Diagnosis not present

## 2015-06-27 DIAGNOSIS — R1319 Other dysphagia: Secondary | ICD-10-CM

## 2015-06-27 DIAGNOSIS — K5909 Other constipation: Secondary | ICD-10-CM

## 2015-06-27 NOTE — Progress Notes (Signed)
Patient ID: Suzanne Bell, female   DOB: August 11, 1919, 82 y.o.   MRN: [id number]   Location:  [facility] Room Number: 1105-2 Place of Service:  SNF (31)   Glade Lloyd, [facility], MD  Patient Care Team: Oneal Grout, MD as PCP - General (Internal Medicine)  Extended Emergency Contact Information Primary Emergency Contact: [name],[name] Address: [address]          [name]  [address] Home Phone: [phone] Relation: None  Code Status:  Full code    Chief Complaint  Patient presents with  . Medical Management of Chronic Issues    Routine Visit    No Known Allergies  HPI:  Pt is a 82 y.o. female seen today for medical management of chronic diseases. She has been seen by SLP team due to increased coughing with meals. She is now on nectar thick liquid with pureed food. Her coughing has subsided. No other concerns from staff. She does not participate in HPI and ROS. She has been at her baseline per nursing staff. No falls reported.    ROS Unable to obtain today      Past Medical History  Diagnosis Date  . COPD (chronic obstructive pulmonary disease) (HCC)   . Hypertension   . Hyperlipidemia   . Diabetes mellitus without complication (HCC)     Type 2  . Osteoporosis   . Cataract   . Arthritis     osteo  . Depression   . Dementia    No past surgical history on file.     Medication List       This list is accurate as of: 06/27/15  3:14 PM.  Always use your most recent med list.               acetaminophen 500 MG tablet  Commonly known as:  TYLENOL  Take 1,000 mg by mouth at bedtime.     ALPRAZolam 0.25 MG tablet  Commonly known as:  XANAX  Take one tablet by mouth twice daily as needed for anxiety     AMBULATORY NON FORMULARY MEDICATION  Medication Name: Med Pass 90 mL three a day for nutritional support     aspirin EC 81 MG tablet  Take 81 mg by mouth daily.     atorvastatin 10 MG tablet  Commonly known as:   LIPITOR  Take 10 mg by mouth at bedtime. For High Cholesterol     cholecalciferol 1000 units tablet  Commonly known as:  VITAMIN D  Take 1,000 Units by mouth daily.     escitalopram 5 MG tablet  Commonly known as:  LEXAPRO  Take 5 mg by mouth daily.     lidocaine-prilocaine cream  Commonly known as:  EMLA  Apply 1 application topically 4 (four) times daily as needed.     lisinopril 2.5 MG tablet  Commonly known as:  PRINIVIL,ZESTRIL  Take 2.5 mg by mouth daily. Reported on 06/27/2015     loperamide 2 MG tablet  Commonly known as:  IMODIUM A-D  Take 2 mg by mouth every 8 (eight) hours as needed for diarrhea or loose stools (For loose stool).     Memantine HCl-Donepezil HCl 28-10 MG Cp24  Take 1 capsule by mouth daily.     multivitamin capsule  Take 1 capsule by mouth daily.     OSCAL 500/200 D-3 500-200 MG-UNIT tablet  Generic drug:  calcium-vitamin D  Take 1 tablet by mouth 2 (two) times daily.  For osteoporosis     psyllium 58.6 % packet  Commonly known as:  METAMUCIL  Take 1 packet by mouth daily. Mix in 6-8 oz of liquid     senna 8.6 MG tablet  Commonly known as:  SENOKOT  Take 2 tablets by mouth at bedtime. For constipation     SYSTANE 0.4-0.3 % Soln  Generic drug:  Polyethyl Glycol-Propyl Glycol  Apply to eye 4 (four) times daily. Both eyes         Immunization History  Administered Date(s) Administered  . Influenza-Unspecified 11/04/2013, 11/09/2014  . PPD Test 12/11/2013  . Pneumococcal-Unspecified 02/29/2012, 11/30/2013   Pertinent  Health Maintenance Due  Topic Date Due  . PNA vac Low Risk Adult (2 of 2 - PCV13) 02/02/2016 (Originally 12/01/2014)  . DEXA SCAN  02/02/2023 (Originally 07/26/1986)  . INFLUENZA VACCINE  09/02/2015  . FOOT EXAM  09/29/2015  . HEMOGLOBIN A1C  10/03/2015  . OPHTHALMOLOGY EXAM  11/19/2015    Physical exam: Filed Vitals:   06/27/15 1509  BP: 127/52  Pulse: 82  Temp: 97.8 F (36.6 C)  TempSrc: Oral  Resp: 20    Height: 5\' 8"  (1.727 m)  Weight: 155 lb 3.2 oz (70.398 kg)  SpO2: 96%   Body mass index is 23.6 kg/(m^2).  General- elderly female in no acute distress, frail, pleasantly confused Head- atraumatic, normocephalic Eyes- no pallor, no icterus Neck- no lymphadenopathy Cardiovascular- normal s1,s2, systolic murmurs, diminished pedal pulses Respiratory- bilateral clear to auscultation, no wheeze, no rhonchi, no crackles Abdomen- bowel sounds present, soft, non tender Musculoskeletal- able to move all 4 extremities, LE weakness > upper extremities, non ambulatory, needs assistance with transfer, trace ankle edema   Neurological- pleasantly confused    Labs reviewed:  Recent Labs  07/31/14 01/21/15 04/02/15  NA 143 143 142  K 4.1 4.0 4.0  BUN 15 20 18   CREATININE 1.0 1.0 1.0    Recent Labs  07/31/14 01/21/15 04/02/15  AST 20 16 13   ALT 18 13 10   ALKPHOS 55 47 48    Recent Labs  07/31/14 01/21/15 04/02/15  WBC 6.4 5.5 5.7  HGB 12.2 11.6* 11.9*  HCT 36 37 38  PLT 298 295 304   No results found for: TSH Lab Results  Component Value Date   HGBA1C 7.2 04/02/2015   Lab Results  Component Value Date   CHOL 160 04/02/2015   HDL 46 04/02/2015   LDLCALC 75 04/02/2015   TRIG 198* 04/02/2015     Assessment/Plan  Dysphagia With her advanced dementia, decline anticipated. Aspiration precautions. Continue dysphagia diet with nectar thick liquid now. Monitor clinically. SLP to follow  Dementia Continue namzeric for now and monitor clinically, decline anticipated. Monitor weight. To provide assistance with her ADLS. Fall precautions. Pressure ulcer prophylaxis.   HTN Stable bp reading, continue lisinopril. bp readings reviewed  Constipation Regulated bowel regimen with the medications. Continue metamucil and senokot   Oneal GroutMAHIMA Astha Probasco, MD Internal Medicine [facility] [address] [place], [place] [address] Cell Phone (Monday-Friday  8 am - 5 pm): [phone] On Call: [phone] and follow prompts after 5 pm and on weekends Office Phone: [phone] Office Fax: [phone]

## 2015-09-05 ENCOUNTER — Encounter: Payer: Self-pay | Admitting: Internal Medicine

## 2015-09-05 ENCOUNTER — Non-Acute Institutional Stay (SKILLED_NURSING_FACILITY): Payer: Medicare Other | Admitting: Internal Medicine

## 2015-09-05 DIAGNOSIS — E43 Unspecified severe protein-calorie malnutrition: Secondary | ICD-10-CM | POA: Diagnosis not present

## 2015-09-05 DIAGNOSIS — F0390 Unspecified dementia without behavioral disturbance: Secondary | ICD-10-CM | POA: Diagnosis not present

## 2015-09-05 DIAGNOSIS — I10 Essential (primary) hypertension: Secondary | ICD-10-CM | POA: Diagnosis not present

## 2015-09-05 DIAGNOSIS — H04123 Dry eye syndrome of bilateral lacrimal glands: Secondary | ICD-10-CM | POA: Diagnosis not present

## 2015-09-05 DIAGNOSIS — F419 Anxiety disorder, unspecified: Secondary | ICD-10-CM | POA: Diagnosis not present

## 2015-09-05 DIAGNOSIS — M810 Age-related osteoporosis without current pathological fracture: Secondary | ICD-10-CM | POA: Diagnosis not present

## 2015-09-05 DIAGNOSIS — E785 Hyperlipidemia, unspecified: Secondary | ICD-10-CM

## 2015-09-05 NOTE — Progress Notes (Signed)
Patient ID: Suzanne Bell, female   DOB: [birth year], 82 y.o.   MRN: [id number]   Location:    Nursing Home Room Number: 1105-2 Place of Service:  SNF (31)   Oneal Grout, MD  Patient Care Team: Oneal Grout, MD as PCP - General (Internal Medicine)  Extended Emergency Contact Information Primary Emergency Contact: [name],[name] Address: [address]          [name]  [address] Home Phone: [phone] Relation: None  Code Status:  Full code    Chief Complaint  Patient presents with  . Medical Management of Chronic Issues    Routine Visit    No Known Allergies  HPI:  Pt is a 82 y.o. female seen today for medical management of chronic diseases. She has been at her baseline. She is under total care. She has advanced dementia and does not participate in HPI and ROS. No falls reported. No pressure ulcer reported. Her po intake has been fair.    ROS Unable to obtain today      Past Medical History:  Diagnosis Date  . Arthritis    osteo  . Cataract   . COPD (chronic obstructive pulmonary disease) (HCC)   . Dementia   . Depression   . Diabetes mellitus without complication (HCC)    Type 2  . Hyperlipidemia   . Hypertension   . Osteoporosis    No past surgical history on file.     Medication List       Accurate as of 09/05/15 12:11 PM. Always use your most recent med list.          acetaminophen 500 MG tablet Commonly known as:  TYLENOL Take 1,000 mg by mouth at bedtime.   ALPRAZolam 0.25 MG tablet Commonly known as:  XANAX Take one tablet by mouth twice daily as needed for anxiety   AMBULATORY NON FORMULARY MEDICATION Medication Name: Med Pass 90 mL three a day for nutritional support   aspirin EC 81 MG tablet Take 81 mg by mouth daily.   atorvastatin 10 MG tablet Commonly known as:  LIPITOR Take 10 mg by mouth at bedtime. For High Cholesterol   cholecalciferol 1000 units tablet Commonly known as:  VITAMIN D Take 1,000 Units by  mouth daily.   escitalopram 5 MG tablet Commonly known as:  LEXAPRO Take 5 mg by mouth daily.   lidocaine-prilocaine cream Commonly known as:  EMLA Apply 1 application topically 4 (four) times daily as needed.   lisinopril 2.5 MG tablet Commonly known as:  PRINIVIL,ZESTRIL Take 2.5 mg by mouth daily. Reported on 06/27/2015   loperamide 2 MG tablet Commonly known as:  IMODIUM A-D Take 2 mg by mouth every 8 (eight) hours as needed for diarrhea or loose stools (For loose stool).   Memantine HCl-Donepezil HCl 28-10 MG Cp24 Take 1 capsule by mouth daily.   multivitamin capsule Take 1 capsule by mouth daily.   OSCAL 500/200 D-3 500-200 MG-UNIT tablet Generic drug:  calcium-vitamin D Take 1 tablet by mouth 2 (two) times daily. For osteoporosis   psyllium 58.6 % packet Commonly known as:  METAMUCIL Take 1 packet by mouth daily. Mix in 6-8 oz of liquid   senna 8.6 MG tablet Commonly known as:  SENOKOT Take 2 tablets by mouth at bedtime. For constipation   SYSTANE 0.4-0.3 % Soln Generic drug:  Polyethyl Glycol-Propyl Glycol Apply to eye 4 (four) times daily. Both eyes        Immunization History  Administered  Date(s) Administered  . Influenza-Unspecified 11/04/2013, 11/09/2014  . PPD Test 12/11/2013  . Pneumococcal-Unspecified 02/29/2012, 11/30/2013   Pertinent  Health Maintenance Due  Topic Date Due  . INFLUENZA VACCINE  09/02/2015  . PNA vac Low Risk Adult (2 of 2 - PCV13) 02/02/2016 (Originally 12/01/2014)  . DEXA SCAN  02/02/2023 (Originally 07/26/1986)  . FOOT EXAM  09/29/2015  . HEMOGLOBIN A1C  10/03/2015  . OPHTHALMOLOGY EXAM  11/19/2015    Physical exam: Vitals:   09/05/15 1206  BP: (!) 113/53  Pulse: 73  Resp: 18  Temp: (!) 96.6 F (35.9 C)  TempSrc: Oral  Weight: 152 lb 12.8 oz (69.3 kg)  Height:  (1.727 m)   Body mass index is 23.23 kg/m.  General- elderly female in no acute distress, frail, pleasantly confused Head- atraumatic,  normocephalic Eyes- no pallor, no icterus Neck- no lymphadenopathy Cardiovascular- normal s1,s2, systolic murmurs, diminished pedal pulses Respiratory- bilateral clear to auscultation, no wheeze, no rhonchi, no crackles Abdomen- bowel sounds present, soft, non tender Musculoskeletal- able to move all 4 extremities, LE weakness > upper extremities, non ambulatory, needs assistance with transfer, trace ankle edema   Neurological- pleasantly confused    Labs reviewed:  Recent Labs  01/21/15 04/02/15  NA 143 142  K 4.0 4.0  BUN 20 18  CREATININE 1.0 1.0    Recent Labs  01/21/15 04/02/15  AST 16 13  ALT 13 10  ALKPHOS 47 48    Recent Labs  01/21/15 04/02/15  WBC 5.5 5.7  HGB 11.6* 11.9*  HCT 37 38  PLT 295 304   No results found for: TSH Lab Results  Component Value Date   HGBA1C 7.2 04/02/2015   Lab Results  Component Value Date   CHOL 160 04/02/2015   HDL 46 04/02/2015   LDLCALC 75 04/02/2015   TRIG 198 (A) 04/02/2015     Assessment/Plan  Senile osteoporosis Continue her calcium and vitamin d supplement, fall precautions.   Anxiety disorder Stable, continue xanax 0.25 mg bid prn  Dry eyes Continue systane eye drops  HTN Stable bp reading. Continue lisinopril 2.5 mg daily and monitor  HLD With her dementia and her advanced age, d/c statin and monitor  Dementia Continue namzeric for now and monitor clinically, decline anticipated. Monitor weight. To provide assistance with her ADLS. Fall precautions. Pressure ulcer prophylaxis.   Protein calorie malnutrition Continue medpass supplement. Monitor weight. Decline anticipated with her advanced dementia   Oneal Grout, MD Internal Medicine [facility] [address] [place], [place] [address] Cell Phone (Monday-Friday 8 am - 5 pm): [phone] On Call: [phone] and follow prompts after 5 pm and on weekends Office Phone: [phone] Office Fax:  [phone]

## 2015-09-30 LAB — BASIC METABOLIC PANEL
BUN: 25 mg/dL — AB (ref 4–21)
CREATININE: 1 mg/dL (ref 0.5–1.1)
Glucose: 133 mg/dL
POTASSIUM: 4.5 mmol/L (ref 3.4–5.3)
SODIUM: 144 mmol/L (ref 137–147)

## 2015-09-30 LAB — LIPID PANEL
Cholesterol: 217 mg/dL — AB (ref 0–200)
HDL: 47 mg/dL (ref 35–70)
LDL CALC: 128 mg/dL
Triglycerides: 210 mg/dL — AB (ref 40–160)

## 2015-09-30 LAB — HEPATIC FUNCTION PANEL
ALK PHOS: 48 U/L (ref 25–125)
ALT: 10 U/L (ref 7–35)
AST: 13 U/L (ref 13–35)
BILIRUBIN, TOTAL: 0.3 mg/dL

## 2015-09-30 LAB — TSH: TSH: 2.6 u[IU]/mL (ref 0.41–5.90)

## 2015-09-30 LAB — CBC AND DIFFERENTIAL
HCT: 37 % (ref 36–46)
Hemoglobin: 11.7 g/dL — AB (ref 12.0–16.0)
Platelets: 402 10*3/uL — AB (ref 150–399)
WBC: 7 10^3/mL

## 2015-09-30 LAB — HEMOGLOBIN A1C: Hemoglobin A1C: 7.5

## 2015-10-10 ENCOUNTER — Encounter: Payer: Self-pay | Admitting: Internal Medicine

## 2015-10-10 ENCOUNTER — Non-Acute Institutional Stay (SKILLED_NURSING_FACILITY): Payer: Medicare Other | Admitting: Internal Medicine

## 2015-10-10 DIAGNOSIS — F329 Major depressive disorder, single episode, unspecified: Secondary | ICD-10-CM

## 2015-10-10 DIAGNOSIS — H259 Unspecified age-related cataract: Secondary | ICD-10-CM | POA: Diagnosis not present

## 2015-10-10 DIAGNOSIS — D692 Other nonthrombocytopenic purpura: Secondary | ICD-10-CM | POA: Insufficient documentation

## 2015-10-10 NOTE — Progress Notes (Signed)
Patient ID: Suzanne Bell, female   DOB: [birth year], 82 y.o.   MRN: [id number]   Location:    Nursing Home Room Number: 1105-2 Place of Service:  SNF (31)   Suzanne Grout, MD  Patient Care Team: Suzanne Grout, MD as PCP - General (Internal Medicine)  Extended Emergency Contact Information Primary Emergency Contact: Suzanne Bell,Suzanne Bell Address: [address]          Suzanne Bell  [address] Home Phone: [phone] Relation: None  Code Status:  Full code    Chief Complaint  Patient presents with  . Medical Management of Chronic Issues    Routine Visit    No Known Allergies  HPI:  Pt is a 82 y.o. female seen today for medical management of chronic diseases. She is under total care. She has advanced dementia and does not participate in HPI and ROS. No falls reported. No pressure ulcer reported. Her po intake has been fair. No new concern from nursing staff.    ROS Unable to obtain today      Past Medical History:  Diagnosis Date  . Arthritis    osteo  . Cataract   . COPD (chronic obstructive pulmonary disease) (HCC)   . Dementia   . Depression   . Diabetes mellitus without complication (HCC)    Type 2  . Hyperlipidemia   . Hypertension   . Osteoporosis    No past surgical history on file.     Medication List       Accurate as of 10/10/15  3:10 PM. Always use your most recent med list.          acetaminophen 500 MG tablet Commonly known as:  TYLENOL Take 1,000 mg by mouth at bedtime.   AMBULATORY NON FORMULARY MEDICATION Medication Name: Med Pass 90 mL three a day for nutritional support   aspirin EC 81 MG tablet Take 81 mg by mouth daily.   cholecalciferol 1000 units tablet Commonly known as:  VITAMIN D Take 1,000 Units by mouth daily.   escitalopram 5 MG tablet Commonly known as:  LEXAPRO Take 5 mg by mouth daily.   lidocaine-prilocaine cream Commonly known as:  EMLA Apply 1 application topically 4 (four) times daily as needed.     lisinopril 2.5 MG tablet Commonly known as:  PRINIVIL,ZESTRIL Take 2.5 mg by mouth daily. Reported on 06/27/2015   loperamide 2 MG tablet Commonly known as:  IMODIUM A-D Take 2 mg by mouth every 8 (eight) hours as needed for diarrhea or loose stools (For loose stool).   Memantine HCl-Donepezil HCl 28-10 MG Cp24 Take 1 capsule by mouth daily.   multivitamin capsule Take 1 capsule by mouth daily.   OSCAL 500/200 D-3 500-200 MG-UNIT tablet Generic drug:  calcium-vitamin D Take 1 tablet by mouth 2 (two) times daily. For osteoporosis   psyllium 58.6 % packet Commonly known as:  METAMUCIL Take 1 packet by mouth daily. Mix in 6-8 oz of liquid   senna 8.6 MG tablet Commonly known as:  SENOKOT Take 2 tablets by mouth at bedtime. For constipation   SYSTANE 0.4-0.3 % Soln Generic drug:  Polyethyl Glycol-Propyl Glycol Apply to eye 4 (four) times daily. Both eyes        Immunization History  Administered Date(s) Administered  . Influenza-Unspecified 11/04/2013, 11/09/2014  . PPD Test 12/11/2013  . Pneumococcal-Unspecified 02/29/2012, 11/30/2013   Pertinent  Health Maintenance Due  Topic Date Due  . PNA vac Low Risk Adult (2 of 2 - PCV13) 02/02/2016 (  Originally 12/01/2014)  . INFLUENZA VACCINE  02/01/2017 (Originally 09/02/2015)  . DEXA SCAN  02/02/2023 (Originally 07/26/1986)  . OPHTHALMOLOGY EXAM  11/19/2015  . HEMOGLOBIN A1C  03/31/2016  . FOOT EXAM  06/16/2016    Physical exam: Vitals:   10/10/15 1459  BP: (!) 142/81  Pulse: 76  Resp: 18  Temp: 97.9 F (36.6 C)  TempSrc: Oral  SpO2: 97%  Weight: 154 lb 9.6 oz (70.1 kg)  Height: 5\' 8"  (1.727 m)   Body mass index is 23.51 kg/m.  General- elderly female in no acute distress, frail Head- atraumatic, normocephalic Eyes- no pallor, no icterus Neck- no lymphadenopathy Cardiovascular- normal s1,s2, systolic murmur +, diminished pedal pulses Respiratory- bilateral clear to auscultation, no wheeze, no rhonchi, no  crackles Abdomen- bowel sounds present, soft, non tender Musculoskeletal- able to move all 4 extremities, LE weakness > upper extremities, non ambulatory, needs assistance with transfer Neurological- pleasantly confused    Labs reviewed:  Recent Labs  01/21/15 04/02/15 09/30/15  NA 143 142 144  K 4.0 4.0 4.5  BUN 20 18 25*  CREATININE 1.0 1.0 1.0    Recent Labs  01/21/15 04/02/15 09/30/15  AST 16 13 13   ALT 13 10 10   ALKPHOS 47 48 48    Recent Labs  01/21/15 04/02/15 09/30/15  WBC 5.5 5.7 7.0  HGB 11.6* 11.9* 11.7*  HCT 37 38 37  PLT 295 304 402*   Lab Results  Component Value Date   TSH 2.60 09/30/2015   Lab Results  Component Value Date   HGBA1C 7.5 09/30/2015   Lab Results  Component Value Date   CHOL 217 (A) 09/30/2015   HDL 47 09/30/2015   LDLCALC 128 09/30/2015   TRIG 210 (A) 09/30/2015     Assessment/Plan  Cataract Age related. Continue systane eye drops  nonthrombocytopenic purpura Chronic and ongoing, to wear gerisleeves to help protect skin tear and breakdown  Chronic Depression Continue her lexapro 5 mg daily and monitor for mood change   Suzanne GroutMAHIMA Suzanne Creson, MD Internal Medicine [facility] [address] [place], [place] [address] Cell Phone (Monday-Friday 8 am - 5 pm): [phone] On Call: [phone] and follow prompts after 5 pm and on weekends Office Phone: [phone] Office Fax: [phone]

## 2015-11-07 ENCOUNTER — Non-Acute Institutional Stay (SKILLED_NURSING_FACILITY): Payer: Medicare Other | Admitting: Internal Medicine

## 2015-11-07 ENCOUNTER — Encounter: Payer: Self-pay | Admitting: Internal Medicine

## 2015-11-07 DIAGNOSIS — E1136 Type 2 diabetes mellitus with diabetic cataract: Secondary | ICD-10-CM

## 2015-11-07 DIAGNOSIS — F0280 Dementia in other diseases classified elsewhere without behavioral disturbance: Secondary | ICD-10-CM

## 2015-11-07 DIAGNOSIS — Z0000 Encounter for general adult medical examination without abnormal findings: Secondary | ICD-10-CM

## 2015-11-07 DIAGNOSIS — E1165 Type 2 diabetes mellitus with hyperglycemia: Secondary | ICD-10-CM | POA: Diagnosis not present

## 2015-11-07 DIAGNOSIS — J449 Chronic obstructive pulmonary disease, unspecified: Secondary | ICD-10-CM | POA: Diagnosis not present

## 2015-11-07 DIAGNOSIS — M810 Age-related osteoporosis without current pathological fracture: Secondary | ICD-10-CM

## 2015-11-07 DIAGNOSIS — K5909 Other constipation: Secondary | ICD-10-CM | POA: Diagnosis not present

## 2015-11-07 DIAGNOSIS — M150 Primary generalized (osteo)arthritis: Secondary | ICD-10-CM | POA: Diagnosis not present

## 2015-11-07 DIAGNOSIS — IMO0002 Reserved for concepts with insufficient information to code with codable children: Secondary | ICD-10-CM

## 2015-11-07 DIAGNOSIS — G301 Alzheimer's disease with late onset: Secondary | ICD-10-CM

## 2015-11-07 DIAGNOSIS — D692 Other nonthrombocytopenic purpura: Secondary | ICD-10-CM | POA: Diagnosis not present

## 2015-11-07 DIAGNOSIS — I10 Essential (primary) hypertension: Secondary | ICD-10-CM | POA: Diagnosis not present

## 2015-11-07 NOTE — Progress Notes (Signed)
Patient ID: KAMIE KORBER, female   DOB: 1919/09/03, 82 y.o.   MRN: [id number]   Location:    Nursing Home Room Number: 1105-2 Place of Service:  SNF (31)   Oneal Grout, MD  Patient Care Team: Oneal Grout, MD as PCP - General (Internal Medicine)  Extended Emergency Contact Information Primary Emergency Contact: [name],[name] Address: [address]          [name]  [address] Home Phone: [phone] Relation: None  Code Status:  Full code No flowsheet data found.   Chief Complaint  Patient presents with  . Annual Exam    Annual Visit    No Known Allergies  HPI:  Pt is a 82 y.o. female seen today for annual visit. She is under total care. She has advanced dementia and does not participate in HPI and ROS. No falls reported. No pressure ulcer reported. Her po intake has been fair. No new concern from nursing staff. No acute behavioral concerns.    ROS Unable to obtain today      Past Medical History:  Diagnosis Date  . Arthritis    osteo  . Cataract   . COPD (chronic obstructive pulmonary disease) (HCC)   . Dementia   . Depression   . Diabetes mellitus without complication (HCC)    Type 2  . Hyperlipidemia   . Hypertension   . Osteoporosis    No past surgical history on file.     Medication List       Accurate as of 11/07/15 12:13 PM. Always use your most recent med list.          acetaminophen 500 MG tablet Commonly known as:  TYLENOL Take 1,000 mg by mouth at bedtime.   AMBULATORY NON FORMULARY MEDICATION Medication Name: Med Pass 90 mL three a day for nutritional support   aspirin EC 81 MG tablet Take 81 mg by mouth daily.   cholecalciferol 1000 units tablet Commonly known as:  VITAMIN D Take 1,000 Units by mouth daily.   escitalopram 5 MG tablet Commonly known as:  LEXAPRO Take 5 mg by mouth daily.   lidocaine-prilocaine cream Commonly known as:  EMLA Apply 1 application topically 4 (four) times daily as needed.     lisinopril 2.5 MG tablet Commonly known as:  PRINIVIL,ZESTRIL Take 2.5 mg by mouth daily. Reported on 06/27/2015   loperamide 2 MG tablet Commonly known as:  IMODIUM A-D Take 2 mg by mouth every 8 (eight) hours as needed for diarrhea or loose stools (For loose stool).   Memantine HCl-Donepezil HCl 28-10 MG Cp24 Take 1 capsule by mouth daily.   multivitamin capsule Take 1 capsule by mouth daily.   OSCAL 500/200 D-3 500-200 MG-UNIT tablet Generic drug:  calcium-vitamin D Take 1 tablet by mouth 2 (two) times daily. For osteoporosis   psyllium 58.6 % packet Commonly known as:  METAMUCIL Take 1 packet by mouth daily. Mix in 6-8 oz of liquid   senna 8.6 MG tablet Commonly known as:  SENOKOT Take 2 tablets by mouth at bedtime. For constipation   SYSTANE 0.4-0.3 % Soln Generic drug:  Polyethyl Glycol-Propyl Glycol Apply to eye 4 (four) times daily. Both eyes        Immunization History  Administered Date(s) Administered  . Influenza-Unspecified 11/04/2013, 11/09/2014  . PPD Test 12/11/2013  . Pneumococcal-Unspecified 02/29/2012, 11/30/2013   Pertinent  Health Maintenance Due  Topic Date Due  . PNA vac Low Risk Adult (2 of 2 - PCV13) 02/02/2016 (  Originally 12/01/2014)  . INFLUENZA VACCINE  02/01/2017 (Originally 09/02/2015)  . DEXA SCAN  02/02/2023 (Originally 07/26/1986)  . OPHTHALMOLOGY EXAM  11/19/2015  . HEMOGLOBIN A1C  03/31/2016  . FOOT EXAM  06/16/2016    Physical exam: Vitals:   11/07/15 1157  BP: (!) 135/59  Pulse: 85  Resp: 18  Temp: 97.2 F (36.2 C)  TempSrc: Oral  Weight: 149 lb 6.4 oz (67.8 kg)  Height: 5\' 8"  (1.727 m)   Body mass index is 22.72 kg/m.  General- elderly female in no acute distress, frail Head- atraumatic, normocephalic Eyes- no pallor, no icterus Neck- no lymphadenopathy Cardiovascular- normal s1,s2, systolic murmur +, diminished pedal pulses Respiratory- bilateral clear to auscultation, no wheeze, no rhonchi, no  crackles Abdomen- bowel sounds present, soft, non tender Musculoskeletal- able to move all 4 extremities, LE weakness > upper extremities, non ambulatory, needs assistance with transfer Neurological- pleasantly confused    Labs reviewed:  Recent Labs  01/21/15 04/02/15 09/30/15  NA 143 142 144  K 4.0 4.0 4.5  BUN 20 18 25*  CREATININE 1.0 1.0 1.0    Recent Labs  01/21/15 04/02/15 09/30/15  AST 16 13 13   ALT 13 10 10   ALKPHOS 47 48 48    Recent Labs  01/21/15 04/02/15 09/30/15  WBC 5.5 5.7 7.0  HGB 11.6* 11.9* 11.7*  HCT 37 38 37  PLT 295 304 402*   Lab Results  Component Value Date   TSH 2.60 09/30/2015   Lab Results  Component Value Date   HGBA1C 7.5 09/30/2015   Lab Results  Component Value Date   CHOL 217 (A) 09/30/2015   HDL 47 09/30/2015   LDLCALC 128 09/30/2015   TRIG 210 (A) 09/30/2015     Assessment/Plan  Annual exam Will need her influenza vaccination. Complaint with her medication. Under total care. No fall reported. No recent hospitalization. Has chronic progressive medical conditions  COPD Stable breathing, no recent exacerbation. Has not required bronchodilators.   Generalized OA On tylenol 1000 mg qhs with ca-vit d supplement   Alzheimer's Dementia Continue supportive care. Continue namzeric  Age related osteoporosis Continue vitamin d and calcium supplement  nonthrombocytopenic purpura Chronic and ongoing, to wear gerisleeves to help protect skin tear and breakdown  Chronic Depression Continue her lexapro 5 mg daily  Chronic constipation Senna has been helpful  HTN stabvle on lisinopril, no changes made, BMP reviewed  Dm type 2 a1c reviewed. Given her age and medical co-morbidities, family has opted for no treatment. Monitor clinically   Oneal GroutMAHIMA Calina Patrie, MD Internal Medicine [facility] [address] [place], [place] [address] Cell Phone (Monday-Friday 8 am - 5 pm): [phone] On  Call: [phone] and follow prompts after 5 pm and on weekends Office Phone: [phone] Office Fax: [phone]

## 2015-12-12 ENCOUNTER — Encounter: Payer: Self-pay | Admitting: Internal Medicine

## 2015-12-12 ENCOUNTER — Non-Acute Institutional Stay (SKILLED_NURSING_FACILITY): Payer: Medicare Other | Admitting: Internal Medicine

## 2015-12-12 DIAGNOSIS — F0280 Dementia in other diseases classified elsewhere without behavioral disturbance: Secondary | ICD-10-CM | POA: Diagnosis not present

## 2015-12-12 DIAGNOSIS — M810 Age-related osteoporosis without current pathological fracture: Secondary | ICD-10-CM | POA: Diagnosis not present

## 2015-12-12 DIAGNOSIS — G301 Alzheimer's disease with late onset: Secondary | ICD-10-CM

## 2015-12-12 DIAGNOSIS — I10 Essential (primary) hypertension: Secondary | ICD-10-CM | POA: Diagnosis not present

## 2015-12-12 NOTE — Progress Notes (Signed)
Patient ID: Suzanne Bell, female   DOB: 1919-05-28, 82 y.o.   MRN: [id number]   Location:    Nursing Home Room Number: 1105-B Place of Service:  SNF (31)   [name], Gregor Dershem, MD  Patient Care Team: Oneal GroutMahima Jillien Yakel, MD as PCP - General (Internal Medicine)  Extended Emergency Contact Information Primary Emergency Contact: [name],[name] Address: [address]          [name],  [id number] Home Phone: [phone] Relation: None  Code Status:  Full code    Chief Complaint  Patient presents with  . Medical Management of Chronic Issues    Routine Visit    No Known Allergies  HPI:  Pt is a 82 y.o. female seen today for medical management of chronic diseases. No new concern from nursing staff. Pt has advanced dementia and does not participate in hpi and ros. She takes her medication. She is under total care with her ADLs. No falls reported. No pressure ulcer reported.    ROS Unable to obtain today      Past Medical History:  Diagnosis Date  . Arthritis    osteo  . Cataract   . COPD (chronic obstructive pulmonary disease) (HCC)   . Dementia   . Depression   . Diabetes mellitus without complication (HCC)    Type 2  . Hyperlipidemia   . Hypertension   . Osteoporosis    No past surgical history on file.     Medication List       Accurate as of 12/12/15  3:09 PM. Always use your most recent med list.          acetaminophen 500 MG tablet Commonly known as:  TYLENOL Take 1,000 mg by mouth at bedtime.   AMBULATORY NON FORMULARY MEDICATION Medication Name: Med Pass 90 mL three a day for nutritional support   aspirin EC 81 MG tablet Take 81 mg by mouth daily.   cholecalciferol 1000 units tablet Commonly known as:  VITAMIN D Take 1,000 Units by mouth daily.   escitalopram 5 MG tablet Commonly known as:  LEXAPRO Take 5 mg by mouth daily.   lidocaine-prilocaine cream Commonly known as:  EMLA Apply 1 application topically 4 (four) times daily as  needed.   lisinopril 2.5 MG tablet Commonly known as:  PRINIVIL,ZESTRIL Take 2.5 mg by mouth daily. Reported on 06/27/2015   loperamide 2 MG tablet Commonly known as:  IMODIUM A-D Take 2 mg by mouth every 8 (eight) hours as needed for diarrhea or loose stools (For loose stool).   Memantine HCl-Donepezil HCl 28-10 MG Cp24 Take 1 capsule by mouth daily.   multivitamin capsule Take 1 capsule by mouth daily.   OSCAL 500/200 D-3 500-200 MG-UNIT tablet Generic drug:  calcium-vitamin D Take 1 tablet by mouth 2 (two) times daily. For osteoporosis   psyllium 58.6 % packet Commonly known as:  METAMUCIL Take 1 packet by mouth daily. Mix in 6-8 oz of liquid   senna 8.6 MG tablet Commonly known as:  SENOKOT Take 2 tablets by mouth at bedtime. For constipation   SYSTANE 0.4-0.3 % Soln Generic drug:  Polyethyl Glycol-Propyl Glycol Apply to eye 4 (four) times daily. Both eyes        Immunization History  Administered Date(s) Administered  . Influenza-Unspecified 11/04/2013, 11/09/2014  . PPD Test 12/11/2013  . Pneumococcal-Unspecified 02/29/2012, 11/30/2013   Pertinent  Health Maintenance Due  Topic Date Due  . OPHTHALMOLOGY EXAM  11/19/2015  . PNA vac Low Risk Adult (  2 of 2 - PCV13) 02/02/2016 (Originally 12/01/2014)  . INFLUENZA VACCINE  02/01/2017 (Originally 09/02/2015)  . DEXA SCAN  02/02/2023 (Originally 07/26/1986)  . HEMOGLOBIN A1C  03/31/2016  . FOOT EXAM  06/16/2016    Physical exam: Vitals:   12/12/15 1505  BP: 106/66  Pulse: 87  Resp: 18  Weight: 150 lb 3.2 oz (68.1 kg)  Height: 5\' 8"  (1.727 m)   Body mass index is 22.84 kg/m.  General- elderly female in no acute distress, frail Head- atraumatic, normocephalic Eyes- no pallor, no icterus Neck- no lymphadenopathy Cardiovascular- normal s1,s2, systolic murmur +, diminished pedal pulses Respiratory- bilateral clear to auscultation, no wheeze, no rhonchi, no crackles Abdomen- bowel sounds present, soft, non  tender Musculoskeletal- able to move all 4 extremities with weakness more prominent in her legs, on wheelchair Neurological- pleasantly confused    Labs reviewed:  Recent Labs  01/21/15 04/02/15 09/30/15  NA 143 142 144  K 4.0 4.0 4.5  BUN 20 18 25*  CREATININE 1.0 1.0 1.0    Recent Labs  01/21/15 04/02/15 09/30/15  AST 16 13 13   ALT 13 10 10   ALKPHOS 47 48 48    Recent Labs  01/21/15 04/02/15 09/30/15  WBC 5.5 5.7 7.0  HGB 11.6* 11.9* 11.7*  HCT 37 38 37  PLT 295 304 402*   Lab Results  Component Value Date   TSH 2.60 09/30/2015   Lab Results  Component Value Date   HGBA1C 7.5 09/30/2015   Lab Results  Component Value Date   CHOL 217 (A) 09/30/2015   HDL 47 09/30/2015   LDLCALC 128 09/30/2015   TRIG 210 (A) 09/30/2015     Assessment/Plan  Essential HTN Stable BP reading. Continue lisinopril 2.5 mg daily, monitor  Senile osteoporosis No fracture and no falls. Continue calcium and vitamin d supplement  Alzheimer's dementia No behavioral disturbance. Continue total assistance with her ADLs. Fall precautions. Skin care. Continue namzaric. Continue dysphagia diet. Decline anticipated   Oneal GroutMAHIMA Exander Shaul, MD Internal Medicine [facility] [address] [place], [place] [address] Cell Phone (Monday-Friday 8 am - 5 pm): [phone] On Call: [phone] and follow prompts after 5 pm and on weekends Office Phone: [phone] Office Fax: [phone]

## 2016-01-20 ENCOUNTER — Encounter: Payer: Self-pay | Admitting: Internal Medicine

## 2016-01-20 ENCOUNTER — Non-Acute Institutional Stay (SKILLED_NURSING_FACILITY): Payer: Medicare Other | Admitting: Internal Medicine

## 2016-01-20 DIAGNOSIS — E1122 Type 2 diabetes mellitus with diabetic chronic kidney disease: Secondary | ICD-10-CM

## 2016-01-20 DIAGNOSIS — N183 Chronic kidney disease, stage 3 (moderate): Secondary | ICD-10-CM | POA: Diagnosis not present

## 2016-01-20 DIAGNOSIS — H04123 Dry eye syndrome of bilateral lacrimal glands: Secondary | ICD-10-CM

## 2016-01-20 DIAGNOSIS — K5909 Other constipation: Secondary | ICD-10-CM

## 2016-01-20 NOTE — Progress Notes (Signed)
Patient ID: Suzanne Bell, female   DOB: 1920/01/23, 82 y.o.   MRN: [id number]   Location:    Nursing Home Room Number: 1105-2 Place of Service:  SNF (31)   [name], Suzanne Rynders, MD  Patient Care Team: Oneal GroutMahima Julita Ozbun, MD as PCP - General (Internal Medicine)  Extended Emergency Contact Information Primary Emergency Contact: [name],[name] Address: [address]          [name],  [id number] Home Phone: [phone] Relation: None  Code Status:  Full code    Chief Complaint  Patient presents with  . Medical Management of Chronic Issues    Routine Visit    No Known Allergies  HPI:  Pt is a 82 y.o. female seen today for medical management of chronic diseases. No new concern from nursing staff. She takes her medication. She is under total care with her ADLs.     ROS Unable to obtain today with her advanced dementia      Past Medical History:  Diagnosis Date  . Arthritis    osteo  . Cataract   . COPD (chronic obstructive pulmonary disease) (HCC)   . Dementia   . Depression   . Diabetes mellitus without complication (HCC)    Type 2  . Hyperlipidemia   . Hypertension   . Osteoporosis    No past surgical history on file.   Allergies as of 01/20/2016   No Known Allergies     Medication List       Accurate as of 01/20/16  3:05 PM. Always use your most recent med list.          acetaminophen 500 MG tablet Commonly known as:  TYLENOL Take 1,000 mg by mouth at bedtime.   AMBULATORY NON FORMULARY MEDICATION Medication Name: Med Pass 90 mL three a day for nutritional support   aspirin EC 81 MG tablet Take 81 mg by mouth daily.   cholecalciferol 1000 units tablet Commonly known as:  VITAMIN D Take 1,000 Units by mouth daily.   lidocaine-prilocaine cream Commonly known as:  EMLA Apply 1 application topically 4 (four) times daily as needed.   lisinopril 2.5 MG tablet Commonly known as:  PRINIVIL,ZESTRIL Take 2.5 mg by mouth daily. Reported on  06/27/2015   loperamide 2 MG tablet Commonly known as:  IMODIUM A-D Take 2 mg by mouth every 8 (eight) hours as needed for diarrhea or loose stools (For loose stool).   multivitamin capsule Take 1 capsule by mouth daily.   OSCAL 500/200 D-3 500-200 MG-UNIT tablet Generic drug:  calcium-vitamin D Take 1 tablet by mouth 2 (two) times daily. For osteoporosis   psyllium 58.6 % packet Commonly known as:  METAMUCIL Take 1 packet by mouth daily. Mix in 6-8 oz of liquid   senna 8.6 MG tablet Commonly known as:  SENOKOT Take 2 tablets by mouth at bedtime. For constipation   SYSTANE 0.4-0.3 % Soln Generic drug:  Polyethyl Glycol-Propyl Glycol Apply to eye 4 (four) times daily. Both eyes        Immunization History  Administered Date(s) Administered  . Influenza-Unspecified 11/04/2013, 11/09/2014  . PPD Test 12/11/2013  . Pneumococcal-Unspecified 02/29/2012, 11/30/2013   Pertinent  Health Maintenance Due  Topic Date Due  . PNA vac Low Risk Adult (2 of 2 - PCV13) 02/02/2016 (Originally 12/01/2014)  . INFLUENZA VACCINE  02/01/2017 (Originally 09/02/2015)  . DEXA SCAN  02/02/2023 (Originally 07/26/1986)  . HEMOGLOBIN A1C  03/31/2016  . FOOT EXAM  06/16/2016  . OPHTHALMOLOGY EXAM  12/29/2016    Physical exam: Vitals:   01/20/16 1456  BP: (!) 163/63  Pulse: 77  Resp: 18  Weight: 150 lb 6.4 oz (68.2 kg)  Height: 5\' 8"  (1.727 m)   Body mass index is 22.87 kg/m.  General- elderly female in no acute distress, frail Head- atraumatic, normocephalic Eyes- no pallor, no icterus Neck- no lymphadenopathy Cardiovascular- normal s1,s2, systolic murmur +, diminished pedal pulses Respiratory- bilateral clear to auscultation, no wheeze, no rhonchi, no crackles Abdomen- bowel sounds present, soft, non tender Musculoskeletal- able to move all 4 extremities with weakness more prominent in her legs, on wheelchair Neurological- pleasantly confused    Labs reviewed:  Recent Labs   01/21/15 04/02/15 09/30/15  NA 143 142 144  K 4.0 4.0 4.5  BUN 20 18 25*  CREATININE 1.0 1.0 1.0    Recent Labs  01/21/15 04/02/15 09/30/15  AST 16 13 13   ALT 13 10 10   ALKPHOS 47 48 48    Recent Labs  01/21/15 04/02/15 09/30/15  WBC 5.5 5.7 7.0  HGB 11.6* 11.9* 11.7*  HCT 37 38 37  PLT 295 304 402*   Lab Results  Component Value Date   TSH 2.60 09/30/2015   Lab Results  Component Value Date   HGBA1C 7.5 09/30/2015   Lab Results  Component Value Date   CHOL 217 (A) 09/30/2015   HDL 47 09/30/2015   LDLCALC 128 09/30/2015   TRIG 210 (A) 09/30/2015     Assessment/Plan  1. Chronic constipation Stable, currently on metamucil but refuses to take it. Discontinue metamucil. Continue senna 2 tab qhs.  2. CKD stage 3 due to type 2 diabetes mellitus (HCC) Monitor bmp. Continue ACEI.   3. 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

## 2016-01-22 NOTE — Progress Notes (Signed)
This encounter was created in error - please disregard.

## 2016-02-16 LAB — HEPATIC FUNCTION PANEL
ALT: 11 U/L (ref 7–35)
AST: 12 U/L — AB (ref 13–35)
Alkaline Phosphatase: 48 U/L (ref 25–125)
BILIRUBIN, TOTAL: 0.4 mg/dL

## 2016-02-16 LAB — LIPID PANEL
CHOLESTEROL: 196 mg/dL (ref 0–200)
HDL: 39 mg/dL (ref 35–70)
LDL Cholesterol: 129 mg/dL
TRIGLYCERIDES: 141 mg/dL (ref 40–160)

## 2016-02-16 LAB — BASIC METABOLIC PANEL
BUN: 23 mg/dL — AB (ref 4–21)
CREATININE: 1 mg/dL (ref 0.5–1.1)
Glucose: 132 mg/dL
POTASSIUM: 3.8 mmol/L (ref 3.4–5.3)
Sodium: 145 mmol/L (ref 137–147)

## 2016-02-16 LAB — CBC AND DIFFERENTIAL
HCT: 32 % — AB (ref 36–46)
Hemoglobin: 10.1 g/dL — AB (ref 12.0–16.0)
Platelets: 290 10*3/uL (ref 150–399)
WBC: 5.9 10*3/mL

## 2016-02-16 LAB — HEMOGLOBIN A1C: HEMOGLOBIN A1C: 6.9

## 2016-04-14 ENCOUNTER — Non-Acute Institutional Stay (SKILLED_NURSING_FACILITY): Payer: Medicare Other | Admitting: Internal Medicine

## 2016-04-14 ENCOUNTER — Encounter: Payer: Self-pay | Admitting: Internal Medicine

## 2016-04-14 DIAGNOSIS — E440 Moderate protein-calorie malnutrition: Secondary | ICD-10-CM | POA: Diagnosis not present

## 2016-04-14 DIAGNOSIS — M159 Polyosteoarthritis, unspecified: Secondary | ICD-10-CM

## 2016-04-14 DIAGNOSIS — M810 Age-related osteoporosis without current pathological fracture: Secondary | ICD-10-CM | POA: Diagnosis not present

## 2016-04-14 NOTE — Progress Notes (Signed)
Patient ID: Suzanne Bell, female   DOB: October 19, 1919, 83 y.o.   MRN: [id number]   Location:    [facility] Room Number: 1105-B Place of Service:  SNF (31)   Suzanne Grout, MD  Patient Care Team: Suzanne Grout, MD as PCP - General (Internal Medicine)  Extended Emergency Contact Information Primary Emergency Contact: Suzanne Bell Address: [address]          [place] Bell  [address] Home Phone: [phone] Relation: None  Code Status:  Full code    Chief Complaint  Patient presents with  . Medical Management of Chronic Issues    Routine Visit     No Known Allergies  HPI:  Pt is a 83 y.o. female seen today for medical management of chronic diseases. She is under total care with her ADLs. No fall has been reported. No pressure ulcer reported. She is compliant with her medications.    ROS Unable to obtain today with her advanced dementia      Past Medical History:  Diagnosis Date  . Arthritis    osteo  . Cataract   . COPD (chronic obstructive pulmonary disease) (HCC)   . Dementia   . Depression   . Diabetes mellitus without complication (HCC)    Type 2  . Hyperlipidemia   . Hypertension   . Osteoporosis    No past surgical history on file.   Allergies as of 04/14/2016   No Known Allergies     Medication List       Accurate as of 04/14/16  4:03 PM. Always use your most recent med list.          acetaminophen 500 MG tablet Commonly known as:  TYLENOL Take 1,000 mg by mouth at bedtime.   AMBULATORY NON FORMULARY MEDICATION Medication Name: Med Pass 90 mL three a day for nutritional support   aspirin EC 81 MG tablet Take 81 mg by mouth daily.   cholecalciferol 1000 units tablet Commonly known as:  VITAMIN D Take 1,000 Units by mouth daily.   DECUBI-VITE PO Take 1 capsule by mouth daily.   lidocaine-prilocaine cream Commonly known as:  EMLA Apply 1 application topically 4 (four) times daily as needed.   lisinopril 2.5 MG  tablet Commonly known as:  PRINIVIL,ZESTRIL Take 2.5 mg by mouth daily. Reported on 06/27/2015   loperamide 2 MG tablet Commonly known as:  IMODIUM A-D Take 2 mg by mouth every 8 (eight) hours as needed for diarrhea or loose stools (For loose stool).   OSCAL 500/200 D-3 500-200 MG-UNIT tablet Generic drug:  calcium-vitamin D Take 1 tablet by mouth 2 (two) times daily. For osteoporosis   senna 8.6 MG tablet Commonly known as:  SENOKOT Take 2 tablets by mouth at bedtime. For constipation   SYSTANE 0.4-0.3 % Soln Generic drug:  Polyethyl Glycol-Propyl Glycol Apply to eye 4 (four) times daily. Both eyes        Immunization History  Administered Date(s) Administered  . Influenza-Unspecified 11/04/2013, 11/09/2014  . PPD Test 12/11/2013  . Pneumococcal-Unspecified 02/29/2012, 11/30/2013   Pertinent  Health Maintenance Due  Topic Date Due  . PNA vac Low Risk Adult (2 of 2 - PCV13) 12/01/2014  . HEMOGLOBIN A1C  03/31/2016  . INFLUENZA VACCINE  02/01/2017 (Originally 09/02/2015)  . DEXA SCAN  02/02/2023 (Originally 07/26/1986)  . FOOT EXAM  06/16/2016  . OPHTHALMOLOGY EXAM  12/29/2016    Physical exam: Vitals:   04/14/16 1520  BP: (!) 141/63  Pulse: 79  Resp: 18  Temp: 99.3 F (37.4 C)  TempSrc: Oral  Weight: 147 lb (66.7 kg)  Height: 5\' 8"  (1.727 m)   Body mass index is 22.35 kg/m.  General- elderly female in no acute distress, frail Head- atraumatic, normocephalic Eyes- no pallor, no icterus Neck- no lymphadenopathy Cardiovascular- normal s1,s2, systolic murmur +, diminished pedal pulses Respiratory- bilateral clear to auscultation, no wheeze, no rhonchi, no crackles Abdomen- bowel sounds present, soft, non tender Musculoskeletal- able to move all 4 extremities with weakness more prominent in her legs, on wheelchair Neurological- pleasantly confused    Labs reviewed:  Recent Labs  09/30/15 02/16/16  NA 144 145  K 4.5 3.8  BUN 25* 23*  CREATININE 1.0 1.0     Recent Labs  09/30/15 02/16/16  AST 13 12*  ALT 10 11  ALKPHOS 48 48    Recent Labs  09/30/15 02/16/16  WBC 7.0 5.9  HGB 11.7* 10.1*  HCT 37 32*  PLT 402* 290   Lab Results  Component Value Date   TSH 2.60 09/30/2015   Lab Results  Component Value Date   HGBA1C 6.9 02/16/2016   Lab Results  Component Value Date   CHOL 196 02/16/2016   HDL 39 02/16/2016   LDLCALC 129 02/16/2016   TRIG 141 02/16/2016     Assessment/Plan  Senile osteoporosis Continue calcium and vitamin D. Fall precautions  Generalized OA Age related. Continue acetaminophen 1000 mg qhs and monitor. Fall precautions  Protein calorie malnutrition Decline anticipated with her dementia. Continue medpass supplement   Suzanne GroutMAHIMA Nadalie Laughner, MD Internal Medicine [facility] [address] [place], [place] [address] Cell Phone (Monday-Friday 8 am - 5 pm): [phone] On Call: [phone] and follow prompts after 5 pm and on weekends Office Phone: [phone] Office Fax: [phone]

## 2016-11-03 ENCOUNTER — Other Ambulatory Visit (HOSPITAL_COMMUNITY): Payer: Self-pay | Admitting: Family Medicine

## 2016-11-03 DIAGNOSIS — R1310 Dysphagia, unspecified: Secondary | ICD-10-CM

## 2016-11-11 ENCOUNTER — Ambulatory Visit (HOSPITAL_COMMUNITY)
Admission: RE | Admit: 2016-11-11 | Discharge: 2016-11-11 | Disposition: A | Discharge status: Home / Self Care | Payer: Medicare Other | Source: Ambulatory Visit | Attending: Family Medicine | Admitting: Family Medicine

## 2016-11-11 DIAGNOSIS — R1312 Dysphagia, oropharyngeal phase: Secondary | ICD-10-CM | POA: Insufficient documentation

## 2016-11-11 DIAGNOSIS — R1310 Dysphagia, unspecified: Secondary | ICD-10-CM

## 2016-11-12 ENCOUNTER — Encounter: Payer: Self-pay | Admitting: Internal Medicine

## 2017-01-01 DEATH — deceased

## 2017-10-13 IMAGING — RF DG SWALLOWING FUNCTION
1 series · 17 of 24 positions shown · non-contrast
Comparison: None.

CLINICAL DATA: Dysphagia.

EXAM:
MODIFIED BARIUM SWALLOW
TECHNIQUE: Different consistencies of barium were administered orally to the
patient by the Speech Pathologist. Imaging of the pharynx was
performed in the lateral projection.
FLUOROSCOPY TIME:  Fluoroscopy Time:  1 minute, 3 seconds.
Number of Acquired Spot Images: 0

[Series 1: run · 11 acquisitions, 17 frames shown]
[im 1/11]
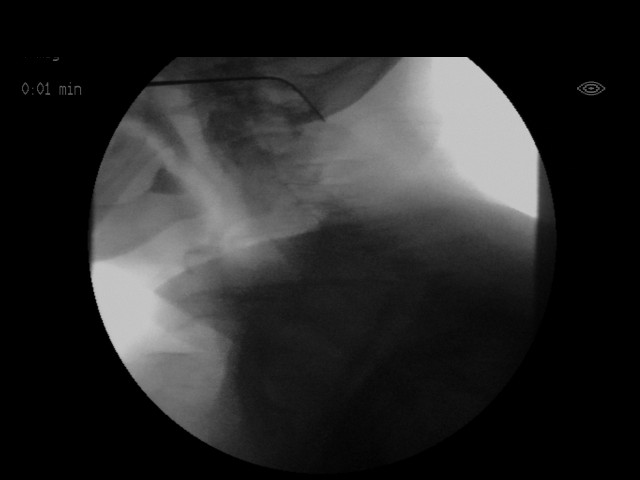
[im 2/11]
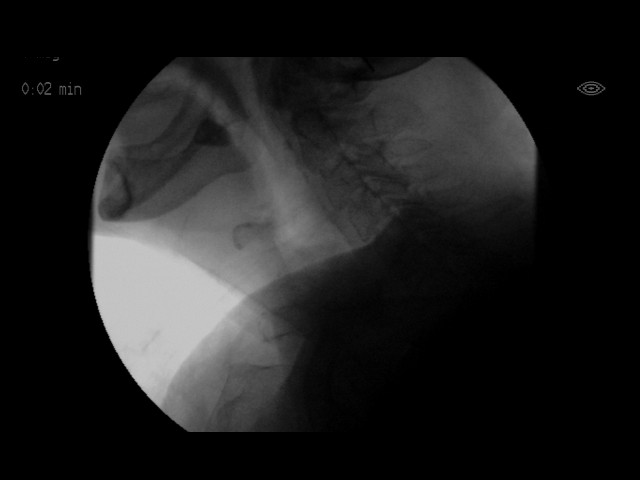
[im 2/11]
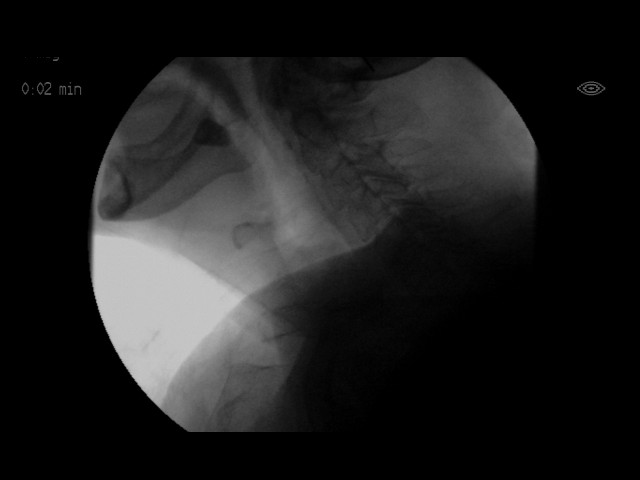
[im 3/11]
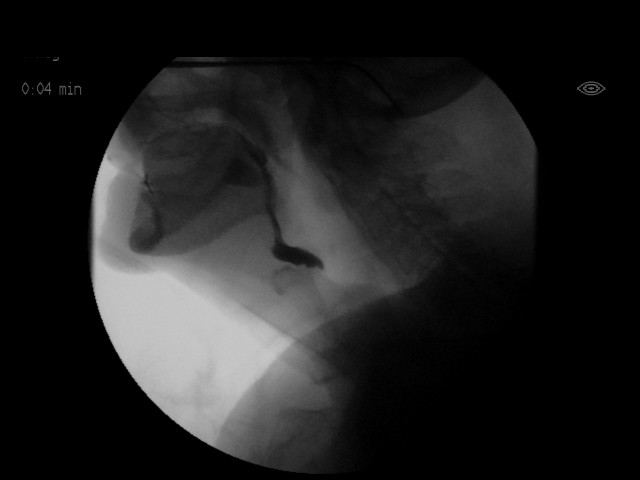
[im 4/11]
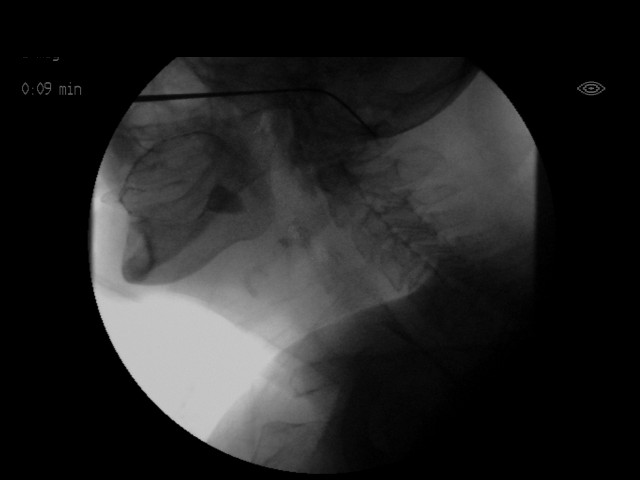
[im 4/11]
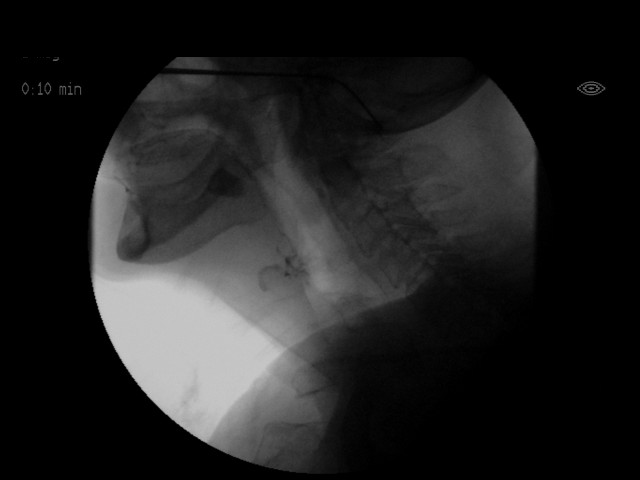
[im 5/11]
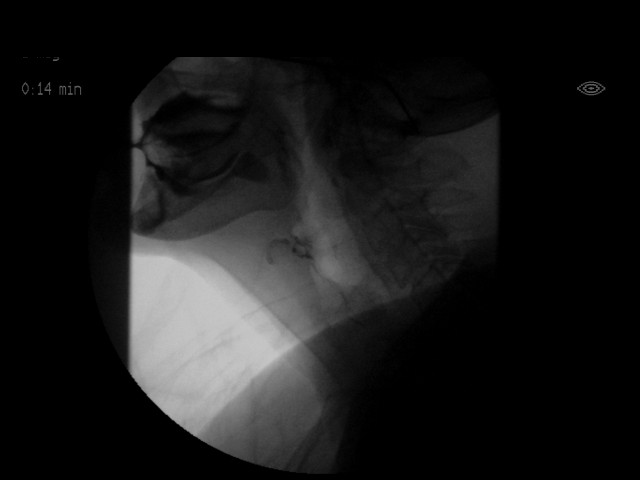
[im 5/11]
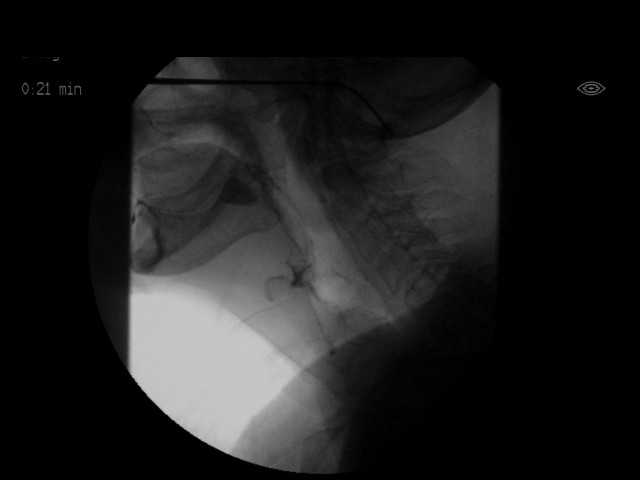
[im 6/11]
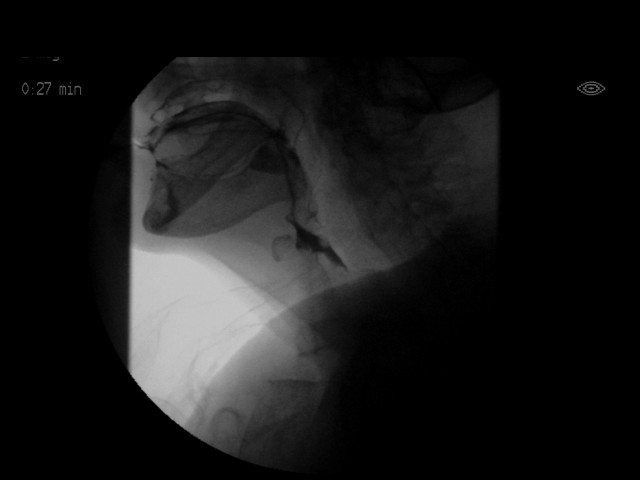
[im 7/11]
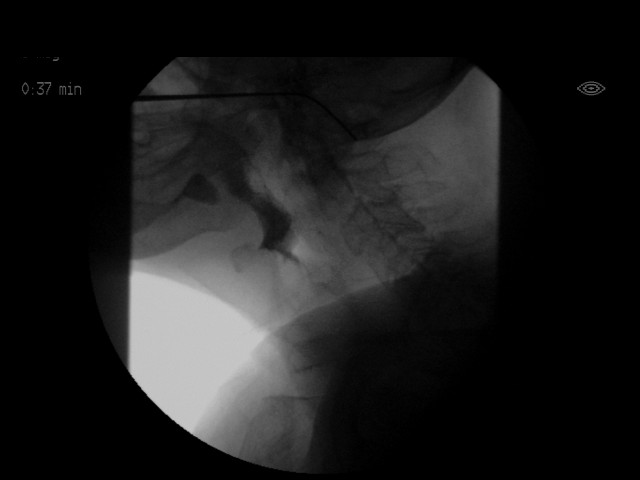
[im 7/11]
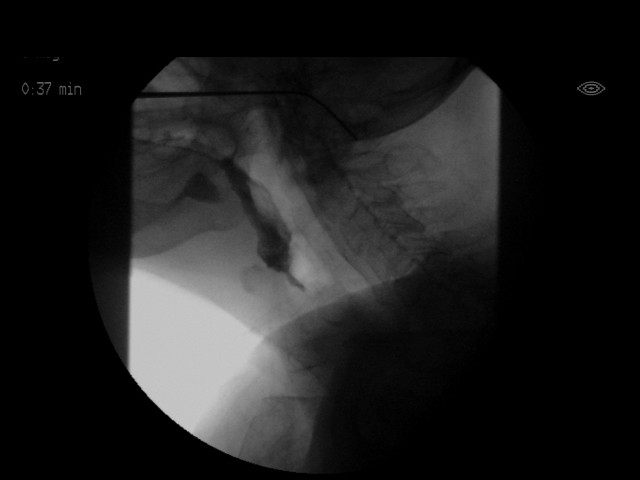
[im 8/11]
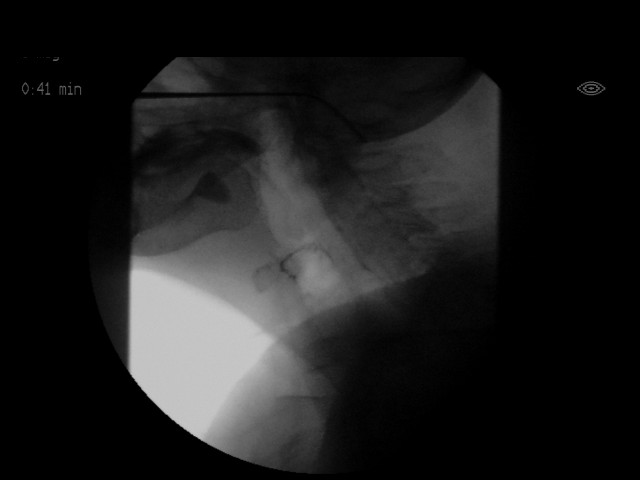
[im 9/11]
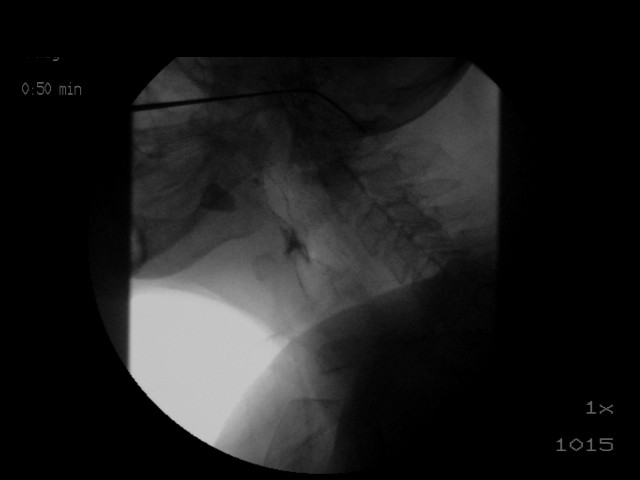
[im 10/11]
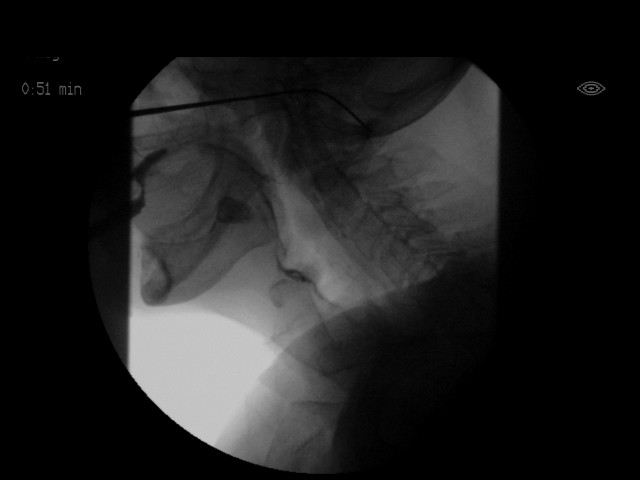
[im 10/11]
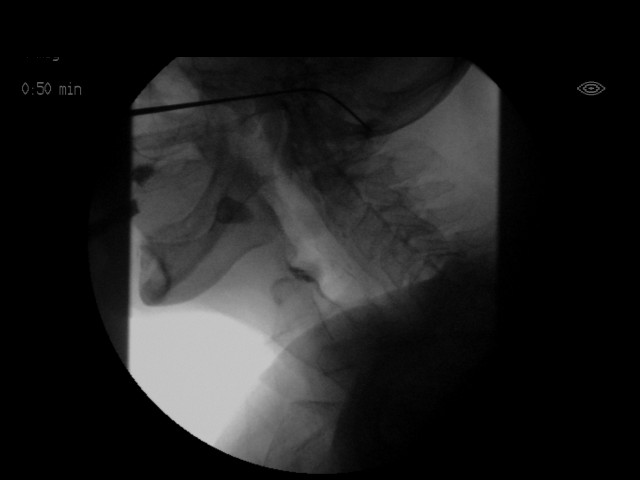
[im 10/11]
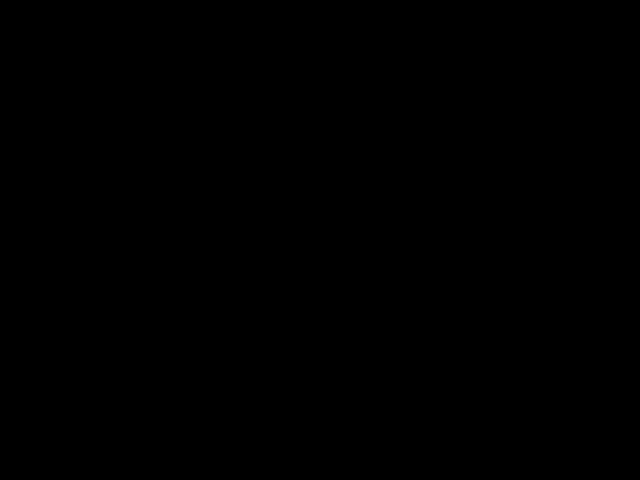
[im 11/11]
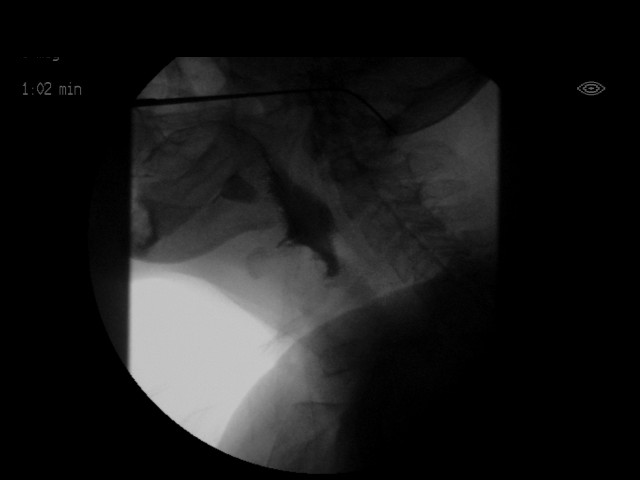

[17 of 24 positions shown; findings below may reference images not displayed]

FINDINGS: Thin liquid- aspiration

Nectar thick liquid- penetration to the vocal cords

Honey- within normal limits

Roberto?[place] within normal limits
IMPRESSION: Abnormal swallow study. Aspiration with thin liquids. Penetration to
the vocal cords with nectar thick liquid.

Please refer to the Speech Pathologists report for complete details
and recommendations.
